# Patient Record
Sex: Female | Born: 1985
Health system: Southern US, Community
[De-identification: ages and names within clinical notes are randomized; demographics above are authoritative.]

## PROBLEM LIST (undated history)

## (undated) ENCOUNTER — Inpatient Hospital Stay: Payer: Self-pay

## (undated) DIAGNOSIS — R319 Hematuria, unspecified: Secondary | ICD-10-CM

## (undated) DIAGNOSIS — F32A Depression, unspecified: Secondary | ICD-10-CM

## (undated) DIAGNOSIS — F419 Anxiety disorder, unspecified: Secondary | ICD-10-CM

## (undated) DIAGNOSIS — U071 COVID-19: Secondary | ICD-10-CM

## (undated) DIAGNOSIS — F329 Major depressive disorder, single episode, unspecified: Secondary | ICD-10-CM

## (undated) DIAGNOSIS — R002 Palpitations: Secondary | ICD-10-CM

## (undated) DIAGNOSIS — E669 Obesity, unspecified: Secondary | ICD-10-CM

## (undated) DIAGNOSIS — L659 Nonscarring hair loss, unspecified: Secondary | ICD-10-CM

## (undated) DIAGNOSIS — Z9289 Personal history of other medical treatment: Secondary | ICD-10-CM

## (undated) DIAGNOSIS — Z8481 Family history of carrier of genetic disease: Secondary | ICD-10-CM

## (undated) DIAGNOSIS — G8929 Other chronic pain: Secondary | ICD-10-CM

## (undated) DIAGNOSIS — Z1371 Encounter for nonprocreative screening for genetic disease carrier status: Secondary | ICD-10-CM

## (undated) DIAGNOSIS — B019 Varicella without complication: Secondary | ICD-10-CM

## (undated) DIAGNOSIS — N200 Calculus of kidney: Secondary | ICD-10-CM

## (undated) DIAGNOSIS — N23 Unspecified renal colic: Secondary | ICD-10-CM

## (undated) HISTORY — DX: Obesity, unspecified: E66.9

## (undated) HISTORY — DX: Varicella without complication: B01.9

## (undated) HISTORY — DX: Personal history of other medical treatment: Z92.89

## (undated) HISTORY — DX: Unspecified renal colic: N23

## (undated) HISTORY — DX: COVID-19: U07.1

## (undated) HISTORY — DX: Family history of carrier of genetic disease: Z84.81

## (undated) HISTORY — DX: Depression, unspecified: F32.A

## (undated) HISTORY — DX: Palpitations: R00.2

## (undated) HISTORY — DX: Hematuria, unspecified: R31.9

## (undated) HISTORY — DX: Other chronic pain: G89.29

## (undated) HISTORY — DX: Major depressive disorder, single episode, unspecified: F32.9

## (undated) HISTORY — PX: WISDOM TOOTH EXTRACTION: SHX21

## (undated) HISTORY — DX: Encounter for nonprocreative screening for genetic disease carrier status: Z13.71

## (undated) HISTORY — DX: Nonscarring hair loss, unspecified: L65.9

## (undated) HISTORY — PX: TONSILLECTOMY: SUR1361

## (undated) HISTORY — DX: Anxiety disorder, unspecified: F41.9

---
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
## 2010-11-08 ENCOUNTER — Observation Stay: Payer: Self-pay | Admitting: Advanced Practice Midwife

## 2011-02-27 ENCOUNTER — Observation Stay: Payer: Self-pay

## 2011-03-01 ENCOUNTER — Observation Stay: Payer: Self-pay | Admitting: Obstetrics and Gynecology

## 2011-03-16 ENCOUNTER — Observation Stay: Payer: Self-pay | Admitting: Emergency Medicine

## 2011-03-20 ENCOUNTER — Inpatient Hospital Stay: Payer: Self-pay | Admitting: Internal Medicine

## 2011-07-07 ENCOUNTER — Ambulatory Visit: Payer: Self-pay | Admitting: Family Medicine

## 2013-09-10 LAB — HM PAP SMEAR: HM Pap smear: NEGATIVE

## 2014-10-10 LAB — OB RESULTS CONSOLE GC/CHLAMYDIA
Chlamydia: NEGATIVE
Gonorrhea: NEGATIVE

## 2014-10-11 LAB — OB RESULTS CONSOLE HIV ANTIBODY (ROUTINE TESTING): HIV: NONREACTIVE

## 2014-11-10 NOTE — L&D Delivery Note (Signed)
Delivery Note   Niayla, Dewan [[id number]]  At 4:37 PM a viable and healthy FEMALE sex was delivered via Vaginal, Spontaneous Delivery (Presentation:OA ;  ).  APGAR: , ; weight  .   Placenta status: Intact, Expressed.  Cord: 3 vessels with the following complications: None.  Anesthesia: Epidural  Episiotomy: None Lacerations: 2nd degree Suture Repair: 2.0 chromic Est. Blood Loss (mL): 500    Higham, PendingBaby [[id number]]  At 4:44 PM a viable and healthy FEMALE sex was delivered via Vaginal, Spontaneous Delivery (Presentation: ; Occiput Anterior).  APGAR: , ; weight  .   Placenta status: , Expressed.  Cord: 3 vessels with the following complications: .  Anesthesia: Epidural  Episiotomy:  None Lacerations:  2nd Degree Suture Repair: 2.0 chromic Est. Blood Loss (mL): 500   Mom to postpartum.   Baby A to NICU.   Baby B to NICU.  Daphine Deutscher A Raeya Merritts 04/26/2015, 5:15 PM

## 2014-11-20 ENCOUNTER — Encounter: Payer: Self-pay | Admitting: Maternal & Fetal Medicine

## 2014-12-04 ENCOUNTER — Encounter: Payer: Self-pay | Admitting: Obstetrics and Gynecology

## 2014-12-12 LAB — OB RESULTS CONSOLE HIV ANTIBODY (ROUTINE TESTING): HIV: NONREACTIVE

## 2014-12-12 LAB — OB RESULTS CONSOLE ABO/RH: RH Type: POSITIVE

## 2014-12-12 LAB — OB RESULTS CONSOLE RUBELLA ANTIBODY, IGM: RUBELLA: NON-IMMUNE/NOT IMMUNE

## 2014-12-12 LAB — OB RESULTS CONSOLE VARICELLA ZOSTER ANTIBODY, IGG: VARICELLA IGG: IMMUNE

## 2014-12-12 LAB — OB RESULTS CONSOLE RPR: RPR: NONREACTIVE

## 2014-12-12 LAB — OB RESULTS CONSOLE ANTIBODY SCREEN: Antibody Screen: NEGATIVE

## 2014-12-12 LAB — OB RESULTS CONSOLE HEPATITIS B SURFACE ANTIGEN: HEP B S AG: NEGATIVE

## 2014-12-18 ENCOUNTER — Encounter: Payer: Self-pay | Admitting: Obstetrics & Gynecology

## 2015-01-01 ENCOUNTER — Encounter: Payer: Self-pay | Admitting: Obstetrics & Gynecology

## 2015-01-07 ENCOUNTER — Observation Stay: Payer: Self-pay | Admitting: Obstetrics and Gynecology

## 2015-01-09 ENCOUNTER — Encounter: Payer: Self-pay | Admitting: Pediatric Cardiology

## 2015-01-12 ENCOUNTER — Emergency Department: Payer: Self-pay | Admitting: Emergency Medicine

## 2015-01-15 ENCOUNTER — Encounter: Payer: Self-pay | Admitting: Obstetrics and Gynecology

## 2015-02-12 ENCOUNTER — Encounter
Admit: 2015-02-12 | Disposition: A | Discharge status: Home / Self Care | Payer: Self-pay | Attending: Obstetrics and Gynecology | Admitting: Obstetrics and Gynecology

## 2015-02-20 ENCOUNTER — Encounter: Admit: 2015-02-20 | Payer: Self-pay | Admitting: Pediatric Cardiology

## 2015-02-23 ENCOUNTER — Other Ambulatory Visit: Payer: Self-pay | Admitting: Obstetrics & Gynecology

## 2015-02-23 DIAGNOSIS — O30039 Twin pregnancy, monochorionic/diamniotic, unspecified trimester: Secondary | ICD-10-CM

## 2015-02-26 ENCOUNTER — Encounter
Admit: 2015-02-26 | Disposition: A | Discharge status: Home / Self Care | Payer: Self-pay | Attending: Obstetrics and Gynecology | Admitting: Obstetrics and Gynecology

## 2015-03-12 ENCOUNTER — Ambulatory Visit
Admission: RE | Admit: 2015-03-12 | Discharge: 2015-03-12 | Disposition: A | Discharge status: Home / Self Care | Payer: No Typology Code available for payment source | Source: Ambulatory Visit | Attending: Obstetrics & Gynecology | Admitting: Obstetrics & Gynecology

## 2015-03-12 ENCOUNTER — Other Ambulatory Visit: Payer: Self-pay | Admitting: Maternal & Fetal Medicine

## 2015-03-12 DIAGNOSIS — O30039 Twin pregnancy, monochorionic/diamniotic, unspecified trimester: Secondary | ICD-10-CM

## 2015-03-12 DIAGNOSIS — Z3A30 30 weeks gestation of pregnancy: Secondary | ICD-10-CM | POA: Insufficient documentation

## 2015-03-12 DIAGNOSIS — O30043 Twin pregnancy, dichorionic/diamniotic, third trimester: Secondary | ICD-10-CM | POA: Diagnosis not present

## 2015-03-12 DIAGNOSIS — O30009 Twin pregnancy, unspecified number of placenta and unspecified number of amniotic sacs, unspecified trimester: Secondary | ICD-10-CM

## 2015-03-12 DIAGNOSIS — O30032 Twin pregnancy, monochorionic/diamniotic, second trimester: Secondary | ICD-10-CM | POA: Diagnosis present

## 2015-03-12 LAB — US OB FOLLOW UP

## 2015-03-20 ENCOUNTER — Encounter: Payer: Self-pay | Admitting: *Deleted

## 2015-03-20 ENCOUNTER — Inpatient Hospital Stay
Admission: EM | Admit: 2015-03-20 | Discharge: 2015-03-20 | Disposition: A | Discharge status: Home / Self Care | Payer: No Typology Code available for payment source | Attending: Obstetrics and Gynecology | Admitting: Obstetrics and Gynecology

## 2015-03-20 DIAGNOSIS — R102 Pelvic and perineal pain: Secondary | ICD-10-CM

## 2015-03-20 DIAGNOSIS — O30033 Twin pregnancy, monochorionic/diamniotic, third trimester: Secondary | ICD-10-CM | POA: Insufficient documentation

## 2015-03-20 DIAGNOSIS — O26899 Other specified pregnancy related conditions, unspecified trimester: Secondary | ICD-10-CM

## 2015-03-20 DIAGNOSIS — Z3A33 33 weeks gestation of pregnancy: Secondary | ICD-10-CM | POA: Diagnosis not present

## 2015-03-20 DIAGNOSIS — Z3490 Encounter for supervision of normal pregnancy, unspecified, unspecified trimester: Secondary | ICD-10-CM

## 2015-03-20 HISTORY — DX: Calculus of kidney: N20.0

## 2015-03-20 NOTE — Discharge Instructions (Signed)
Drink plenty of fluid and get plentyt of rest, call your provider for any other concerns!

## 2015-03-20 NOTE — Outcomes Assessment (Signed)
Patient discharged home, discharge instructions reviewed, pt stats understanding. Pt ambulated off floor in stable condition, denies any other needs at this time.

## 2015-03-20 NOTE — Procedures (Signed)
  Baby A Fetal Heart Rate:  Baseline: 145 bpm  FHR Tracing Category: Cat 1  NST Findings:  reactive  CST:  N/A     Baby B Fetal Heart Rate:130  FHR Tracing Category: Cat 1  NST Findings:  reactive  CST:  N/A    Json Koelzer, Daphine DeutscherMARTIN, MD

## 2015-03-20 NOTE — OB Triage Note (Signed)
Pt here for scheduled NST.

## 2015-03-20 NOTE — H&P (Signed)
L&D Evaluation:  History Expanded:  HPI 29 yo G2P1001 at 3748w2d gestational age by L/9.  Pregnancy complicated by mono-di twin gestation.  She presents with acute-onset lower abdominal pain and discomfort from early this morning. It is described as cramping sensation that starts in her lower abdomen and at times wraps around to her back.  The pain improved this afternoon.  However, she had one episode of vaginal spotting this afernoon.  She notes no urinary symptoms, no vaginal symptoms.  Denies fevers, chills, nausea, vomiting, diarrhea, constipation.  She notes positive fetal movement of both fetuses, no leakage of fluid.   Blood Type (Maternal) A positive   Group B Strep Results Maternal (Result >5wks must be treated as unknown) unknown/result > 5 weeks ago   Maternal HIV Negative   Maternal Syphilis Ab Nonreactive   Maternal Varicella Immune   Rubella Results ([facility]aternal) nonimmune   [facility]   Presents with abdominal pain   Patient's Medical History No Chronic Illness   Patient's Surgical History tonsillectomy   Medications Pre Natal Vitamins  diclegis   Allergies NKDA   Social History none   Family History Non-Contributory   ROS:  ROS All systems were reviewed.  HEENT, CNS, GI, GU, Respiratory, CV, Renal and Musculoskeletal systems were found to be normal., unless noted in HPI   Exam:  Vital Signs T98.4, BP 128/72, p84, RR18   General no apparent distress   Mental Status clear   Chest clear   Heart normal sinus rhythm   Abdomen gravid, non-tender   Estimated Fetal Weight Average for gestational age   Fetal Position baby A (mat Right cephalic), baby B (mat left, breech)   Back no CVAT   Edema no edema   Pelvic no external lesions, cervix closed and thick   Mebranes Intact   FHT normal for gestational age, fetus A 145, fetus B 135   Ucx absent   Skin no lesions   Other Bedside ultrasound performed showing two viable fetuses in the  above-described position.  FHR obtained via M mode for both.  subj normal fluid, placenta is posterior.  Cervix appeared closed.   Impression:  Impression 1) Intrauterine pregnancy at 3748w2d gestational age, 2) mono-di twin gestation, 3) abdominal painj, 4) microscopic hematuria   Plan:  Comments 1) UA - shows hematuria, urine culture, wet prep neg, GC/CT-pending 2) reassuring fetal status on exam and ultrasound 3) hematuria: will send urine for culture.  Discussed possibility of nephroureterolithiasis.  She has no major symptoms at this point.  Discussed potential for worsening flank and back pain.  Will continue to monitor symptoms as outpatient.   Labs:  Lab Results:  Routine Micro:  28-Feb-16 19:21   Micro Text Report WET PREP   COMMENT                   FEW WHITE BLOOD CELLS SEEN   COMMENT                   NO TRICHOMONAS,SPERMATOZOA,YEAST,OR CLUE CELLS SEEN   ANTIBIOTIC                       Comment 1. FEW WHITE BLOOD CELLS SEEN  Comment 2. NO TRICHOMONAS,SPERMATOZOA,YEAST,OR CLUE CELLS SEEN  Result(s) reported on 07 Jan 2015 at 07:44PM.  Routine UA:  28-Feb-16 18:36   Color (UA) Yellow  Clarity (UA) Clear  Glucose (UA) Negative  Bilirubin (UA) Negative  Ketones (UA) Negative  Specific  Gravity (UA) 1.011  Blood (UA) 3+  pH (UA) 6.0  Protein (UA) Negative  Nitrite (UA) Negative  Leukocyte Esterase (UA) Negative (Result(s) reported on 07 Jan 2015 at 07:09PM.)  RBC (UA) 461 /HPF  WBC (UA) NONE SEEN  Bacteria (UA) 1+  Epithelial Cells (UA) 3 /HPF  Mucous (UA) PRESENT (Result(s) reported on 07 Jan 2015 at 07:09PM.)   Electronic Signatures: Conard NovakJackson, Claritza July D (MD)  (Signed 28-Feb-16 21:13)  Authored: L&D Evaluation, Labs   Last Updated: 28-Feb-16 21:13 by Conard NovakJackson, Waunita Sandstrom D (MD)

## 2015-03-26 ENCOUNTER — Ambulatory Visit
Admission: RE | Admit: 2015-03-26 | Discharge: 2015-03-26 | Disposition: A | Discharge status: Home / Self Care | Payer: No Typology Code available for payment source | Source: Ambulatory Visit | Attending: Maternal & Fetal Medicine | Admitting: Maternal & Fetal Medicine

## 2015-03-26 DIAGNOSIS — Z3A32 32 weeks gestation of pregnancy: Secondary | ICD-10-CM | POA: Diagnosis not present

## 2015-03-26 DIAGNOSIS — O30009 Twin pregnancy, unspecified number of placenta and unspecified number of amniotic sacs, unspecified trimester: Secondary | ICD-10-CM

## 2015-03-26 DIAGNOSIS — O30033 Twin pregnancy, monochorionic/diamniotic, third trimester: Secondary | ICD-10-CM | POA: Diagnosis not present

## 2015-03-26 LAB — US OB LIMITED

## 2015-03-28 ENCOUNTER — Observation Stay
Admission: EM | Admit: 2015-03-28 | Discharge: 2015-03-28 | Disposition: A | Discharge status: Home / Self Care | Payer: No Typology Code available for payment source | Attending: Obstetrics and Gynecology | Admitting: Obstetrics and Gynecology

## 2015-03-28 DIAGNOSIS — O26899 Other specified pregnancy related conditions, unspecified trimester: Principal | ICD-10-CM

## 2015-03-28 DIAGNOSIS — R102 Pelvic and perineal pain: Secondary | ICD-10-CM | POA: Insufficient documentation

## 2015-03-28 LAB — URINALYSIS COMPLETE WITH MICROSCOPIC (ARMC ONLY)
Bilirubin Urine: NEGATIVE
Glucose, UA: 50 mg/dL — AB
Hgb urine dipstick: NEGATIVE
Ketones, ur: NEGATIVE mg/dL
Nitrite: NEGATIVE
PROTEIN: NEGATIVE mg/dL
Specific Gravity, Urine: 1.008 (ref 1.005–1.030)
pH: 6 (ref 5.0–8.0)

## 2015-03-28 NOTE — Discharge Instructions (Signed)
Drink plenty of fluids and get plenty of rest, call your provider for any other concerns. [facility]

## 2015-03-28 NOTE — Discharge Summary (Signed)
Patient discharged home, discharge instructions given, patient states understanding. Patient left floor in stable condition, denies any other needs at this time. Patient to keep next scheduled OB appointment: Wednesday.

## 2015-03-28 NOTE — OB Triage Provider Note (Signed)
  History    28 yoWMF G2P1001 at 32.5 weeks with Mono/Di Vtx/? Concordant twins presents with pelvic pressure. N o fluid leak or vaginal bleeding. CSN: [id number]  Arrival date and time: 03/28/15 1337   None     Chief Complaint  Patient presents with  . Pelvic Pain   Pelvic Pain The patient's primary symptoms include pelvic pain.    OB History    Gravida Para Term Preterm AB TAB SAB Ectopic Multiple Living   2 1 1       1       Past Medical History  Diagnosis Date  . Kidney stone     Past Surgical History  Procedure Laterality Date  . Tonsillectomy    . Wisdom tooth extraction      No family history on file.  History  Substance Use Topics  . Smoking status: Never Smoker   . Smokeless tobacco: Never Used  . Alcohol Use: No    Allergies: No Known Allergies  Prescriptions prior to admission  Medication Sig Dispense Refill Last Dose  . folic acid (FOLVITE) 1 MG tablet Take 1 mg by mouth daily.     . nitrofurantoin, macrocrystal-monohydrate, (MACROBID) 100 MG capsule Take 100 mg by mouth at bedtime.     . Prenatal Vit-Fe Fumarate-FA (PRENATAL MULTIVITAMIN) TABS tablet Take 1 tablet by mouth daily at 12 noon.       Review of Systems  Genitourinary: Positive for pelvic pain.   Physical Exam   Blood pressure 119/72, pulse 89, temperature 98.3 F (36.8 C), temperature source Oral, last menstrual period 08/11/2014.  Physical Exam  Constitutional: She appears well-nourished. No distress.  GI: Soft. There is no tenderness.  Genitourinary: Vagina normal and uterus normal. No vaginal discharge found.  Skin: Skin is warm and dry.  Psychiatric: She has a normal mood and affect.  NAD; Uterus nontender; Cervix 1-2/30/-2/VTX  MAU Course  Procedures NST INTERPRETATION:  Indications: multiple gestation  Mode: External Baseline Rate (A): 130 bpm ; Baseline Rate (B) 140 bpm  Variability: Moderate; (B) Moderate Accelerations: 15 x 15; (B) 15 x 15 Decelerations: None;  (B) NBone     Contraction Frequency (min): irregular, occasional   Impression: reactive, reactive Mono/Di Twins   Plan: 1.) [facility]KC BID. 2. Weekly NST alternating with Weekly MFM Doppler studies. 3.) Fetal ECHO on 04/10/15 for assessment of fetal heart to [facility].    Javaris Wigington, Daphine DeutscherMARTIN, MD  MDM   Assessment and Plan    Chase Knebel 03/28/2015, 4:54 PM

## 2015-03-28 NOTE — OB Triage Note (Signed)
Complains of rectal pressure.

## 2015-03-28 NOTE — OB Triage Note (Signed)
Patient states she started having pelvic pressure yesterday, pt states that it is constant and does not rate on a pain scale, "its just pressure". Pt also states that she has been peeing more often than normal pt denies any bleeding or burning

## 2015-03-31 LAB — URINE CULTURE: Special Requests: NORMAL

## 2015-04-04 ENCOUNTER — Encounter: Payer: Self-pay | Admitting: *Deleted

## 2015-04-04 ENCOUNTER — Observation Stay
Admission: EM | Admit: 2015-04-04 | Discharge: 2015-04-04 | Disposition: A | Discharge status: Home / Self Care | Payer: No Typology Code available for payment source | Attending: Obstetrics and Gynecology | Admitting: Obstetrics and Gynecology

## 2015-04-04 DIAGNOSIS — O30039 Twin pregnancy, monochorionic/diamniotic, unspecified trimester: Principal | ICD-10-CM | POA: Insufficient documentation

## 2015-04-04 DIAGNOSIS — Z3689 Encounter for other specified antenatal screening: Secondary | ICD-10-CM

## 2015-04-04 LAB — OB RESULTS CONSOLE GBS: STREP GROUP B AG: NEGATIVE

## 2015-04-04 LAB — OB RESULTS CONSOLE GC/CHLAMYDIA
CHLAMYDIA, DNA PROBE: NEGATIVE
Gonorrhea: NEGATIVE

## 2015-04-09 NOTE — Progress Notes (Signed)
NST INTERPRETATION:  Indications: multiple gestation and Mono/Di  Mode: External Baseline Rate (A): 130 bpm  (B) 135 Variability: Moderate  (B) Moderate Accelerations: 15 x 15 (B) 15 x 15 Decelerations: None  (B) None     Contraction Frequency (min): irritablility   Impression: reactive/reactive Mono/Di Twins   Plan: Continue Antepartum testing as recommended per [facility].    Scheryl MartenMarth Edelmiro Innocent, MD

## 2015-04-11 ENCOUNTER — Ambulatory Visit (INDEPENDENT_AMBULATORY_CARE_PROVIDER_SITE_OTHER): Payer: No Typology Code available for payment source | Admitting: Obstetrics and Gynecology

## 2015-04-11 VITALS — BP 112/77 | Wt 212.2 lb

## 2015-04-11 DIAGNOSIS — Z331 Pregnant state, incidental: Secondary | ICD-10-CM

## 2015-04-11 DIAGNOSIS — Z1389 Encounter for screening for other disorder: Secondary | ICD-10-CM

## 2015-04-11 LAB — POCT URINALYSIS DIPSTICK
BILIRUBIN UA: NEGATIVE
Glucose, UA: NEGATIVE
Ketones, UA: NEGATIVE
NITRITE UA: NEGATIVE
PH UA: 6
Spec Grav, UA: 1.02
UROBILINOGEN UA: 0.2

## 2015-04-11 NOTE — Progress Notes (Signed)
Subjective:Patient complains of sore throat and nasal congestion.  Running nose is most significant issue.  No fever, chills or sweats.  Mild cough. Objective: No acute distress.  Oropharynx clear.  Tympanic membranes normal bilaterally.  Lungs clear. Assessment: Probable allergies. Plan: Antihistamine when necessary; saltwater gargles when necessary; Cepacol lozenges when necessary.

## 2015-04-12 ENCOUNTER — Ambulatory Visit
Admission: RE | Admit: 2015-04-12 | Discharge: 2015-04-12 | Disposition: A | Discharge status: Home / Self Care | Payer: No Typology Code available for payment source | Source: Ambulatory Visit | Attending: Maternal & Fetal Medicine | Admitting: Maternal & Fetal Medicine

## 2015-04-12 DIAGNOSIS — O30039 Twin pregnancy, monochorionic/diamniotic, unspecified trimester: Secondary | ICD-10-CM

## 2015-04-12 DIAGNOSIS — O30033 Twin pregnancy, monochorionic/diamniotic, third trimester: Secondary | ICD-10-CM | POA: Insufficient documentation

## 2015-04-12 DIAGNOSIS — IMO0001 Reserved for inherently not codable concepts without codable children: Secondary | ICD-10-CM

## 2015-04-16 ENCOUNTER — Ambulatory Visit
Admission: RE | Admit: 2015-04-16 | Discharge: 2015-04-16 | Disposition: A | Discharge status: Home / Self Care | Payer: No Typology Code available for payment source | Source: Ambulatory Visit | Attending: Obstetrics & Gynecology | Admitting: Obstetrics & Gynecology

## 2015-04-16 DIAGNOSIS — Z3A35 35 weeks gestation of pregnancy: Secondary | ICD-10-CM | POA: Diagnosis not present

## 2015-04-16 DIAGNOSIS — O30009 Twin pregnancy, unspecified number of placenta and unspecified number of amniotic sacs, unspecified trimester: Secondary | ICD-10-CM

## 2015-04-16 DIAGNOSIS — O30033 Twin pregnancy, monochorionic/diamniotic, third trimester: Secondary | ICD-10-CM | POA: Insufficient documentation

## 2015-04-16 DIAGNOSIS — IMO0001 Reserved for inherently not codable concepts without codable children: Secondary | ICD-10-CM

## 2015-04-16 LAB — US OB LIMITED

## 2015-04-18 ENCOUNTER — Ambulatory Visit (INDEPENDENT_AMBULATORY_CARE_PROVIDER_SITE_OTHER): Payer: No Typology Code available for payment source | Admitting: Obstetrics and Gynecology

## 2015-04-18 VITALS — BP 115/76 | HR 89 | Wt 217.2 lb

## 2015-04-18 DIAGNOSIS — O30033 Twin pregnancy, monochorionic/diamniotic, third trimester: Secondary | ICD-10-CM

## 2015-04-18 DIAGNOSIS — Z3493 Encounter for supervision of normal pregnancy, unspecified, third trimester: Secondary | ICD-10-CM

## 2015-04-18 LAB — POCT URINALYSIS DIPSTICK
Bilirubin, UA: NEGATIVE
Blood, UA: NEGATIVE
GLUCOSE UA: NEGATIVE
Ketones, UA: NEGATIVE
NITRITE UA: NEGATIVE
Protein, UA: NEGATIVE
SPEC GRAV UA: 1.015
Urobilinogen, UA: 0.2
pH, UA: 7

## 2015-04-18 NOTE — Progress Notes (Signed)
Pt states last few days both legs swell and hurt. Starts about mid day.

## 2015-04-18 NOTE — Progress Notes (Signed)
No PIH symptoms. Plan: IOL at 37.0 weeks.

## 2015-04-18 NOTE — Patient Instructions (Signed)
Plan: 1.   [facility]. 2.  IOL at 37.0 weeks.

## 2015-04-19 ENCOUNTER — Ambulatory Visit
Admission: RE | Admit: 2015-04-19 | Discharge: 2015-04-19 | Disposition: A | Discharge status: Home / Self Care | Payer: No Typology Code available for payment source | Source: Ambulatory Visit | Attending: Maternal & Fetal Medicine | Admitting: Maternal & Fetal Medicine

## 2015-04-19 DIAGNOSIS — Z3A35 35 weeks gestation of pregnancy: Secondary | ICD-10-CM | POA: Diagnosis not present

## 2015-04-19 DIAGNOSIS — Z3689 Encounter for other specified antenatal screening: Secondary | ICD-10-CM

## 2015-04-19 DIAGNOSIS — O30013 Twin pregnancy, monochorionic/monoamniotic, third trimester: Secondary | ICD-10-CM | POA: Insufficient documentation

## 2015-04-23 ENCOUNTER — Ambulatory Visit
Admission: RE | Admit: 2015-04-23 | Discharge: 2015-04-23 | Disposition: A | Discharge status: Home / Self Care | Payer: No Typology Code available for payment source | Source: Ambulatory Visit | Attending: Obstetrics & Gynecology | Admitting: Obstetrics & Gynecology

## 2015-04-23 DIAGNOSIS — O30033 Twin pregnancy, monochorionic/diamniotic, third trimester: Secondary | ICD-10-CM | POA: Insufficient documentation

## 2015-04-23 DIAGNOSIS — O30013 Twin pregnancy, monochorionic/monoamniotic, third trimester: Secondary | ICD-10-CM

## 2015-04-25 ENCOUNTER — Encounter: Payer: Self-pay | Admitting: Obstetrics and Gynecology

## 2015-04-25 ENCOUNTER — Ambulatory Visit (INDEPENDENT_AMBULATORY_CARE_PROVIDER_SITE_OTHER): Payer: No Typology Code available for payment source | Admitting: Obstetrics and Gynecology

## 2015-04-25 VITALS — BP 125/80 | HR 76 | Wt 218.3 lb

## 2015-04-25 DIAGNOSIS — R319 Hematuria, unspecified: Secondary | ICD-10-CM

## 2015-04-25 DIAGNOSIS — Z369 Encounter for antenatal screening, unspecified: Secondary | ICD-10-CM

## 2015-04-25 DIAGNOSIS — Z36 Encounter for antenatal screening of mother: Secondary | ICD-10-CM

## 2015-04-25 DIAGNOSIS — Z1389 Encounter for screening for other disorder: Secondary | ICD-10-CM

## 2015-04-25 DIAGNOSIS — O0993 Supervision of high risk pregnancy, unspecified, third trimester: Secondary | ICD-10-CM

## 2015-04-25 LAB — POCT URINALYSIS DIPSTICK
Bilirubin, UA: NEGATIVE
Glucose, UA: NEGATIVE
Ketones, UA: NEGATIVE
Nitrite, UA: NEGATIVE
PH UA: 6
PROTEIN UA: NEGATIVE
Spec Grav, UA: 1.01
Urobilinogen, UA: 0.2

## 2015-04-25 NOTE — Progress Notes (Signed)
Obstetric History and Physical  Caycee Zakkiyya Barno is a 29 y.o. G2P1001 with Mono/di twinIUP at 45w5dpresenting for Pitocin induction of labor. Patient states she has been having  irregular, none vaginal bleeding, intact membranes, with active fetal movement.    Prenatal Course [facility] Pregnancy complications or risks: Patient Active Problem List   Diagnosis Date Noted  . Pelvic pain affecting pregnancy 03/28/2015  . Pregnancy 03/20/2015  . Monochorionic diamniotic twin gestation in third trimester    She plans to breastfeed She desires condoms for postpartum contraception.   Prenatal labs and studies: ABO, Rh: A/Positive/-- (02/02 0000) Antibody: Negative (02/02 0000) Rubella: Nonimmune (02/02 0000) RPR: Nonreactive (02/02 0000)  HBsAg: Negative (02/02 0000)  HIV: Non-reactive (02/02 0000)  GOXB:DZHGDJME(05/25 0000) 1 hr Glucola  Normal Genetic screening normal Anatomy UKoreaabnormal with questionable valvular abnormality on twin A; [facility]  Maternal Diabetes: No Genetic Screening: Normal Maternal Ultrasounds/Referrals: Abnormal:  Findings:   Other:Fetal valvular abnormality, monitored, twin A Fetal Ultrasounds or other Referrals:  Other: Pediatric cardiology assessed Fetal cardiac anatomy and [facility] Maternal Substance Abuse:  No Significant Maternal Medications:  None Significant Maternal Lab Results: None  Past Medical History  Diagnosis Date  . Kidney stone     macrobid daily  . Hematuria   . BRCA negative     [place] brca 2 gene in family  . Ureteral colic     Past Surgical History  Procedure Laterality Date  . Tonsillectomy    . Wisdom tooth extraction      OB History  Gravida Para Term Preterm AB SAB TAB Ectopic Multiple Living  '2  1 1       2    ' # Outcome Date GA Lbr Len/2nd Weight Sex Delivery Anes PTL Lv  2 Current           1 Term 2010   7 lb 10 oz (3.459 kg) M Vag-Spont   Y    Obstetric Comments  Baby A-decreased movement.    History   Social History  . Marital Status: Married    Spouse Name: N/A  . Number of Children: N/A  . 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 Smoking status: Never Smoker   . Smokeless tobacco: Never Used  . Alcohol Use: No  . Drug Use: No  . Sexual Activity: No   Other Topics Concern  . None   Social History Narrative    Family History  Problem Relation Age of Onset  . Ovarian cancer Maternal Grandmother 30  . Breast cancer Maternal Grandmother 434 . Pancreatic cancer Mother   . Ovarian cancer Maternal Aunt 65  . Breast cancer Maternal Aunt   . Skin cancer Maternal Aunt      (Not in a hospital admission)  Allergies  Allergen Reactions  . Codeine Nausea Only    Review of Systems: Negative except for what is mentioned in HPI.  Physical Exam: BP 125/80 mmHg  Pulse 76  Wt 218 lb 5 oz (99.026 kg)  LMP 08/11/2014 (Exact Date) CONSTITUTIONAL: Well-developed, well-nourished female in no acute distress.  HENT:  Normocephalic, atraumatic, External right and left ear normal. Oropharynx is clear and moist EYES: Conjunctivae and EOM are normal.  Pupils are equal, round, and reactive to light. No scleral icterus.  NECK: Normal range of motion, supple, no masses SKIN: Skin is warm and dry. No rash noted. Not diaphoretic. No erythema. No pallor. [place]: Alert and oriented to person, place, and time. Normal reflexes, muscle tone coordination. No cranial nerve deficit noted. PSYCHIATRIC: Normal mood and affect. Normal behavior. Normal judgment and thought content. CARDIOVASCULAR: Normal heart rate noted, regular rhythm RESPIRATORY: Effort and breath sounds normal, no problems with respiration  noted ABDOMEN: Soft, nontender, nondistended, gravid.Fundal height 41 cm MUSCULOSKELETAL: Normal range of motion. 2+ edema and no tenderness. 2+ distal pulses.  Cervical Exam: Dilatation 3.5cm   Effacement 80%   Station -1   Presentation: cephalic FHT:  Baseline rate 128/141 bpm      Pertinent Labs/Studies:   Results for orders placed or performed in visit on 04/25/15 (from the past 24 hour(s))  POCT urinalysis dipstick     Status: Abnormal   Collection Time: 04/25/15  2:50 PM  Result Value Ref Range   Color, UA yellow    Clarity, UA cloudy    Glucose, UA negative    Bilirubin, UA negative    Ketones, UA negative    Spec Grav, UA 1.010    Blood, UA moderate    pH, UA 6.0    Protein, UA negative    Urobilinogen, UA 0.2    Nitrite, UA negative    Leukocytes, UA small (1+) (A) Negative    Assessment : Miquel Lamson is a 29 y.o. G2P1002 at 17w5dwith mono/Di twins being admitted for labor Induction. Plan: Labor:Induction/Augmentation as needed, per protocol. FWB: Reassuring fetal heart tracing.  GBS negative Epidural as needed. Delivery plan: Hopeful for vaginal delivery  Victorious Kundinger A. DZipporah Plants MD, FACOG [facility]

## 2015-04-25 NOTE — Progress Notes (Signed)
ZIKA EXPOSURE SCREEN:  The patient has not traveled to a Zika Virus endemic area within the past 6 months, nor has she had unprotected sex with a partner who has travelled to a Zika endemic region within the past 6 months. The patient has been advised to notify us if these factors change any time during this current pregnancy, so adequate testing and monitoring can be initiated.  

## 2015-04-26 ENCOUNTER — Inpatient Hospital Stay: Payer: No Typology Code available for payment source | Admitting: Certified Registered"

## 2015-04-26 ENCOUNTER — Inpatient Hospital Stay: Admission: RE | Admit: 2015-04-26 | Payer: No Typology Code available for payment source | Source: Ambulatory Visit

## 2015-04-26 ENCOUNTER — Inpatient Hospital Stay
Admission: EM | Admit: 2015-04-26 | Discharge: 2015-04-28 | DRG: 775 | Disposition: A | Discharge status: Home / Self Care | Payer: No Typology Code available for payment source | Attending: Obstetrics and Gynecology | Admitting: Obstetrics and Gynecology

## 2015-04-26 ENCOUNTER — Encounter: Payer: Self-pay | Admitting: *Deleted

## 2015-04-26 DIAGNOSIS — O30033 Twin pregnancy, monochorionic/diamniotic, third trimester: Secondary | ICD-10-CM | POA: Diagnosis present

## 2015-04-26 DIAGNOSIS — Z3490 Encounter for supervision of normal pregnancy, unspecified, unspecified trimester: Secondary | ICD-10-CM

## 2015-04-26 DIAGNOSIS — Z3A36 36 weeks gestation of pregnancy: Secondary | ICD-10-CM | POA: Diagnosis present

## 2015-04-26 DIAGNOSIS — O26833 Pregnancy related renal disease, third trimester: Secondary | ICD-10-CM | POA: Diagnosis present

## 2015-04-26 DIAGNOSIS — O30043 Twin pregnancy, dichorionic/diamniotic, third trimester: Secondary | ICD-10-CM | POA: Diagnosis present

## 2015-04-26 DIAGNOSIS — N2889 Other specified disorders of kidney and ureter: Secondary | ICD-10-CM | POA: Diagnosis present

## 2015-04-26 DIAGNOSIS — Z3483 Encounter for supervision of other normal pregnancy, third trimester: Secondary | ICD-10-CM | POA: Diagnosis not present

## 2015-04-26 DIAGNOSIS — R319 Hematuria, unspecified: Secondary | ICD-10-CM | POA: Diagnosis present

## 2015-04-26 DIAGNOSIS — N200 Calculus of kidney: Secondary | ICD-10-CM | POA: Diagnosis present

## 2015-04-26 DIAGNOSIS — IMO0001 Reserved for inherently not codable concepts without codable children: Secondary | ICD-10-CM

## 2015-04-26 LAB — CBC
HEMATOCRIT: 36 % (ref 35.0–47.0)
Hemoglobin: 12.1 g/dL (ref 12.0–16.0)
MCH: 31.1 pg (ref 26.0–34.0)
MCHC: 33.7 g/dL (ref 32.0–36.0)
MCV: 92.3 fL (ref 80.0–100.0)
Platelets: 223 10*3/uL (ref 150–440)
RBC: 3.9 MIL/uL (ref 3.80–5.20)
RDW: 13.9 % (ref 11.5–14.5)
WBC: 10 10*3/uL (ref 3.6–11.0)

## 2015-04-26 LAB — ABO/RH: ABO/RH(D): A POS

## 2015-04-26 LAB — TYPE AND SCREEN
ABO/RH(D): A POS
Antibody Screen: NEGATIVE

## 2015-04-26 MED ORDER — IBUPROFEN 800 MG PO TABS
800.0000 mg | ORAL_TABLET | Freq: Three times a day (TID) | ORAL | Status: DC
  Administered 2015-04-26 – 2015-04-28 (×5): 800 mg via ORAL
  Filled 2015-04-26 (×5): qty 1

## 2015-04-26 MED ORDER — LIDOCAINE HCL (PF) 1 % IJ SOLN
INTRAMUSCULAR | Status: AC
  Filled 2015-04-26: qty 30

## 2015-04-26 MED ORDER — DIBUCAINE 1 % RE OINT: 1.0000 "application " | TOPICAL_OINTMENT | RECTAL | Status: DC | PRN

## 2015-04-26 MED ORDER — OXYTOCIN 40 UNITS IN LACTATED RINGERS INFUSION - SIMPLE MED
1.0000 m[IU]/min | INTRAVENOUS | Status: DC
  Administered 2015-04-26: 1 m[IU]/min via INTRAVENOUS

## 2015-04-26 MED ORDER — BENZOCAINE-MENTHOL 20-0.5 % EX AERO
1.0000 "application " | INHALATION_SPRAY | CUTANEOUS | Status: DC | PRN
  Administered 2015-04-28: 1 via TOPICAL
  Filled 2015-04-26 (×2): qty 56

## 2015-04-26 MED ORDER — OXYTOCIN 10 UNIT/ML IJ SOLN
INTRAMUSCULAR | Status: AC
  Filled 2015-04-26: qty 2

## 2015-04-26 MED ORDER — OXYTOCIN BOLUS FROM INFUSION
500.0000 mL | INTRAVENOUS | Status: DC
  Administered 2015-04-26: 500 mL via INTRAVENOUS

## 2015-04-26 MED ORDER — PRENATAL MULTIVITAMIN CH
1.0000 | ORAL_TABLET | Freq: Every day | ORAL | Status: DC
  Administered 2015-04-27 – 2015-04-28 (×2): 1 via ORAL
  Filled 2015-04-26 (×2): qty 1

## 2015-04-26 MED ORDER — LANOLIN HYDROUS EX OINT
TOPICAL_OINTMENT | CUTANEOUS | Status: DC | PRN
Start: 2015-04-26 — End: 2015-04-28

## 2015-04-26 MED ORDER — FENTANYL 2.5 MCG/ML W/ROPIVACAINE 0.2% IN NS 100 ML EPIDURAL INFUSION (ARMC-ANES)
EPIDURAL | Status: AC
  Administered 2015-04-26: 9 mL/h via EPIDURAL
  Filled 2015-04-26: qty 100

## 2015-04-26 MED ORDER — ONDANSETRON HCL 4 MG/2ML IJ SOLN: 4.0000 mg | Freq: Four times a day (QID) | INTRAMUSCULAR | Status: DC | PRN

## 2015-04-26 MED ORDER — ONDANSETRON HCL 4 MG/2ML IJ SOLN: 4.0000 mg | INTRAMUSCULAR | Status: DC | PRN

## 2015-04-26 MED ORDER — OXYCODONE-ACETAMINOPHEN 5-325 MG PO TABS
1.0000 | ORAL_TABLET | ORAL | Status: DC | PRN
  Administered 2015-04-26 – 2015-04-28 (×7): 1 via ORAL
  Filled 2015-04-26 (×6): qty 1

## 2015-04-26 MED ORDER — LIDOCAINE-EPINEPHRINE (PF) 1.5 %-1:200000 IJ SOLN
INTRAMUSCULAR | Status: DC | PRN
  Administered 2015-04-26 (×2): 3 mL

## 2015-04-26 MED ORDER — BUPIVACAINE HCL (PF) 0.25 % IJ SOLN
INTRAMUSCULAR | Status: DC | PRN
  Administered 2015-04-26 (×2): 4 mL

## 2015-04-26 MED ORDER — FERROUS SULFATE 325 (65 FE) MG PO TABS
325.0000 mg | ORAL_TABLET | Freq: Two times a day (BID) | ORAL | Status: DC
  Administered 2015-04-27 – 2015-04-28 (×3): 325 mg via ORAL
  Filled 2015-04-26 (×3): qty 1

## 2015-04-26 MED ORDER — CITRIC ACID-SODIUM CITRATE 334-500 MG/5ML PO SOLN: 30.0000 mL | ORAL | Status: DC | PRN

## 2015-04-26 MED ORDER — ACETAMINOPHEN 325 MG PO TABS
650.0000 mg | ORAL_TABLET | ORAL | Status: DC | PRN
Start: 2015-04-26 — End: 2015-04-28

## 2015-04-26 MED ORDER — ONDANSETRON HCL 4 MG PO TABS
4.0000 mg | ORAL_TABLET | ORAL | Status: DC | PRN
Start: 2015-04-26 — End: 2015-04-28

## 2015-04-26 MED ORDER — LIDOCAINE HCL (PF) 1 % IJ SOLN: 30.0000 mL | INTRAMUSCULAR | Status: DC | PRN

## 2015-04-26 MED ORDER — SIMETHICONE 80 MG PO CHEW: 80.0000 mg | CHEWABLE_TABLET | ORAL | Status: DC | PRN

## 2015-04-26 MED ORDER — MEASLES, MUMPS & RUBELLA VAC ~~LOC~~ INJ
0.5000 mL | INJECTION | Freq: Once | SUBCUTANEOUS | Status: AC
  Administered 2015-04-28: 0.5 mL via SUBCUTANEOUS
  Filled 2015-04-26 (×2): qty 0.5

## 2015-04-26 MED ORDER — DOCUSATE SODIUM 100 MG PO CAPS
100.0000 mg | ORAL_CAPSULE | Freq: Two times a day (BID) | ORAL | Status: DC
  Administered 2015-04-26 – 2015-04-28 (×4): 100 mg via ORAL
  Filled 2015-04-26 (×4): qty 1

## 2015-04-26 MED ORDER — OXYCODONE-ACETAMINOPHEN 5-325 MG PO TABS: 1.0000 | ORAL_TABLET | ORAL | Status: DC | PRN

## 2015-04-26 MED ORDER — AMMONIA AROMATIC IN INHA
RESPIRATORY_TRACT | Status: AC
  Filled 2015-04-26: qty 10

## 2015-04-26 MED ORDER — ACETAMINOPHEN 325 MG PO TABS
650.0000 mg | ORAL_TABLET | ORAL | Status: DC | PRN
  Administered 2015-04-26: 650 mg via ORAL

## 2015-04-26 MED ORDER — DIPHENHYDRAMINE HCL 25 MG PO CAPS: 25.0000 mg | ORAL_CAPSULE | Freq: Four times a day (QID) | ORAL | Status: DC | PRN

## 2015-04-26 MED ORDER — LACTATED RINGERS IV SOLN: 500.0000 mL | INTRAVENOUS | Status: DC | PRN

## 2015-04-26 MED ORDER — OXYCODONE-ACETAMINOPHEN 5-325 MG PO TABS: 2.0000 | ORAL_TABLET | ORAL | Status: DC | PRN

## 2015-04-26 MED ORDER — OXYTOCIN 40 UNITS IN LACTATED RINGERS INFUSION - SIMPLE MED
62.5000 mL/h | INTRAVENOUS | Status: DC
  Administered 2015-04-26: 62.5 mL/h via INTRAVENOUS

## 2015-04-26 MED ORDER — TETANUS-DIPHTH-ACELL PERTUSSIS 5-2.5-18.5 LF-MCG/0.5 IM SUSP: 0.5000 mL | INTRAMUSCULAR | Status: DC | PRN

## 2015-04-26 MED ORDER — BUPIVACAINE HCL (PF) 0.25 % IJ SOLN
INTRAMUSCULAR | Status: DC | PRN
  Administered 2015-04-26 (×2): 5 mL via EPIDURAL

## 2015-04-26 MED ORDER — WITCH HAZEL-GLYCERIN EX PADS: 1.0000 "application " | MEDICATED_PAD | CUTANEOUS | Status: DC | PRN

## 2015-04-26 MED ORDER — OXYCODONE-ACETAMINOPHEN 5-325 MG PO TABS
2.0000 | ORAL_TABLET | ORAL | Status: DC | PRN
  Filled 2015-04-26: qty 2

## 2015-04-26 MED ORDER — LACTATED RINGERS IV SOLN
INTRAVENOUS | Status: DC
  Administered 2015-04-26: 08:00:00 via INTRAVENOUS

## 2015-04-26 MED ORDER — TERBUTALINE SULFATE 1 MG/ML IJ SOLN: 0.2500 mg | Freq: Once | INTRAMUSCULAR | Status: DC | PRN

## 2015-04-26 MED ORDER — OXYTOCIN 40 UNITS IN LACTATED RINGERS INFUSION - SIMPLE MED
INTRAVENOUS | Status: AC
  Administered 2015-04-26: 1 m[IU]/min via INTRAVENOUS
  Filled 2015-04-26: qty 1000

## 2015-04-26 MED ORDER — ACETAMINOPHEN 325 MG PO TABS
ORAL_TABLET | ORAL | Status: AC
  Administered 2015-04-26: 650 mg via ORAL
  Filled 2015-04-26: qty 2

## 2015-04-26 MED ORDER — MISOPROSTOL 200 MCG PO TABS
ORAL_TABLET | ORAL | Status: AC
  Filled 2015-04-26: qty 4

## 2015-04-26 NOTE — Progress Notes (Signed)
Baby A HR dropped from baseline to 60s-90s for 3 mins between 1510-1513 with gradual return to baseline after pt. Was repositioned. Dr. Greggory Keen notified @1525 . Approved continuing to run pitocin as long as HR returned to baseline.

## 2015-04-26 NOTE — Anesthesia Preprocedure Evaluation (Signed)
Anesthesia Evaluation  Patient identified by MRN, date of birth, ID band Patient awake    Reviewed: Allergy & Precautions, H&P , NPO status , Patient's Chart, lab work & pertinent test results  History of Anesthesia Complications Negative for: history of anesthetic complications  Airway Mallampati: I  TM Distance: >3 FB Neck ROM: full    Dental no notable dental hx.    Pulmonary neg pulmonary ROS, neg pneumonia -,    Pulmonary exam normal       Cardiovascular negative cardio ROS Normal cardiovascular examRhythm:regular Rate:Normal     Neuro/Psych negative neurological ROS  negative psych ROS   GI/Hepatic Neg liver ROS, GERD-  ,  Endo/Other  negative endocrine ROS  Renal/GU Renal disease  negative genitourinary   Musculoskeletal   Abdominal   Peds  Hematology negative hematology ROS (+)   Anesthesia Other Findings   Reproductive/Obstetrics (+) Pregnancy                             Anesthesia Physical Anesthesia Plan  ASA: II  Anesthesia Plan: Epidural   Post-op Pain Management:    Induction:   Airway Management Planned:   Additional Equipment:   Intra-op Plan:   Post-operative Plan:   Informed Consent: I have reviewed the patients History and Physical, chart, labs and discussed the procedure including the risks, benefits and alternatives for the proposed anesthesia with the patient or authorized representative who has indicated his/her understanding and acceptance.     Plan Discussed with: CRNA  Anesthesia Plan Comments:         Anesthesia Quick Evaluation

## 2015-04-26 NOTE — H&P (Signed)
See Progress note from 04/25/2015. No Interval changes

## 2015-04-26 NOTE — Anesthesia Procedure Notes (Addendum)
Epidural Patient location during procedure: OB Start time: 04/26/2015 12:10 PM End time: 04/26/2015 12:35 PM  Staffing Anesthesiologist: Forestine Chute Resident/CRNA: Mathews Argyle Performed by: resident/CRNA   Preanesthetic Checklist Completed: patient identified, site marked, surgical consent, pre-op evaluation, timeout performed, IV checked, risks and benefits discussed and monitors and equipment checked  Epidural Patient position: sitting Prep: Betadine and site prepped and draped Patient monitoring: heart rate, continuous pulse ox and blood pressure Approach: midline Location: L4-L5 Injection technique: LOR saline  Needle:  Needle type: Tuohy  Needle gauge: 18 G Needle length: 9 cm and 9 Needle insertion depth: 5 cm Catheter type: closed end flexible Catheter size: 20 Guage Catheter at skin depth: 9 cm Test dose: negative and 1.5% lidocaine with Epi 1:200 K  Assessment Events: blood not aspirated, injection not painful, no injection resistance, negative IV test and no paresthesia  Additional Notes   Patient tolerated the insertion well without complications.Reason for block:procedure for pain  Epidural Patient location during procedure: OB Start time: 04/26/2015 1:55 PM End time: 04/26/2015 2:02 PM  Staffing Anesthesiologist: Forestine Chute Performed by: anesthesiologist   Preanesthetic Checklist Completed: patient identified, site marked, surgical consent, pre-op evaluation, timeout performed, IV checked, risks and benefits discussed and monitors and equipment checked  Epidural Patient position: sitting Prep: ChloraPrep Patient monitoring: blood pressure, continuous pulse ox and heart rate Approach: midline Location: L3-L4 Injection technique: LOR saline  Needle:  Needle type: Tuohy  Needle gauge: 18 G Needle length: 9 cm Needle insertion depth: 7 cm Catheter type: closed end Catheter size: 20 Guage Catheter at skin depth: 12 cm Test dose: negative  and 1.5% lidocaine with Epi 1:200 K  Assessment Sensory level: T10 Events: blood not aspirated, injection not painful, no injection resistance and no paresthesia  Additional Notes Reason for block:procedure for pain

## 2015-04-26 NOTE — H&P (Signed)
Expand All Collapse All   Obstetric History and Physical  Anita Jacobs is a 29 y.o. G2P1001 with Mono/di twinIUP at 74w5dpresenting for Pitocin induction of labor. Patient states she has been having irregular, none vaginal bleeding, intact membranes, with active fetal movement.   Prenatal Course Source of Care: [facility] Pregnancy complications or risks: Patient Active Problem List   Diagnosis Date Noted  . Pelvic pain affecting pregnancy 03/28/2015  . Pregnancy 03/20/2015  . Monochorionic diamniotic twin gestation in third trimester    She plans to breastfeed She desires condoms for postpartum contraception.   Prenatal labs and studies: ABO, Rh: A/Positive/-- (02/02 0000) Antibody: Negative (02/02 0000) Rubella: Nonimmune (02/02 0000) RPR: Nonreactive (02/02 0000)  HBsAg: Negative (02/02 0000)  HIV: Non-reactive (02/02 0000)  GTZG:YFVCBSWH(05/25 0000) 1 hr Glucola Normal Genetic screening normal Anatomy [place]abnormal with questionable valvular abnormality on twin A; [facility]  Maternal Diabetes: No Genetic Screening: Normal Maternal Ultrasounds/Referrals: Abnormal: Findings: Other:Fetal valvular abnormality, monitored, twin A Fetal Ultrasounds or other Referrals: Other: Pediatric cardiology assessed [facility] cardiac anatomy and [facility] Maternal Substance Abuse: No Significant Maternal Medications: None Significant Maternal Lab Results: None  Past Medical History  Diagnosis Date  . Kidney stone     macrobid daily  . Hematuria   . BRCA negative     [facility] brca 2 gene in family  . Ureteral colic     Past Surgical History  Procedure Laterality Date  . Tonsillectomy    . Wisdom tooth extraction       OB History  Gravida Para Term Preterm AB SAB TAB Ectopic Multiple Living  '2 1 1       2    ' # Outcome Date GA Lbr Len/2nd Weight Sex Delivery Anes PTL Lv  2 Current           1 Term 2010   7 lb 10 oz (3.459 kg) M Vag-Spont   Y    Obstetric Comments  Baby A-decreased movement.    History   Social History  . Marital Status: Married    Spouse Name: N/A  . Number of Children: N/A  . 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 Smoking status: Never Smoker   . Smokeless tobacco: Never Used  . Alcohol Use: No  . Drug Use: No  . Sexual Activity: No   Other Topics Concern  . None   Social History Narrative    Family History  Problem Relation Age of Onset  . Ovarian cancer Maternal Grandmother 30  . Breast cancer Maternal Grandmother 426 . Pancreatic cancer Mother   . Ovarian cancer Maternal Aunt 65  . Breast cancer Maternal Aunt   . Skin cancer Maternal Aunt      (Not in a hospital admission)  Allergies  Allergen Reactions  . Codeine Nausea Only    Review of Systems: Negative except for what is mentioned in HPI.  Physical Exam: BP 125/80 mmHg  Pulse 76  Wt 218 lb 5 oz (99.026 kg)  LMP 08/11/2014 (Exact Date) CONSTITUTIONAL: Well-developed, well-nourished female in no acute distress.  HENT: Normocephalic, atraumatic, External right and left ear normal. Oropharynx is clear and moist EYES: Conjunctivae and EOM are normal. Pupils are equal, round,  and reactive to light. No scleral icterus.  NECK: Normal range of motion, supple, no masses SKIN: Skin is warm and dry. No rash noted. Not diaphoretic. No erythema. No pallor. [place]: Alert and oriented to person, place, and time. Normal reflexes, muscle tone coordination. No cranial nerve deficit  noted. PSYCHIATRIC: Normal mood and affect. Normal behavior. Normal judgment and thought content. CARDIOVASCULAR: Normal heart rate noted, regular rhythm RESPIRATORY: Effort and breath sounds normal, no problems with respiration noted ABDOMEN: Soft, nontender, nondistended, gravid.Fundal height 41 cm MUSCULOSKELETAL: Normal range of motion. 2+ edema and no tenderness. 2+ distal pulses.  Cervical Exam: Dilatation 3.5cm Effacement 80% Station -1  Presentation: cephalic FHT: Baseline rate 128/141 bpm   Pertinent Labs/Studies:   Lab Results Last 24 Hours    Results for orders placed or performed in visit on 04/25/15 (from the past 24 hour(s))  POCT urinalysis dipstick Status: Abnormal   Collection Time: 04/25/15 2:50 PM  Result Value Ref Range   Color, UA yellow    Clarity, UA cloudy    Glucose, UA negative    Bilirubin, UA negative    Ketones, UA negative    Spec Grav, UA 1.010    Blood, UA moderate    pH, UA 6.0    Protein, UA negative    Urobilinogen, UA 0.2    Nitrite, UA negative    Leukocytes, UA small (1+) (A) Negative      Assessment : Anita Jacobs is a 29 y.o. G2P1002 at 32w5dwith mono/Di twins being admitted for labor Induction. Plan: Labor:Induction/Augmentation as needed, per protocol. FWB: Reassuring fetal heart tracing. GBS negative Epidural as needed. Delivery plan: Hopeful for vaginal delivery  Marie Borowski A. DZipporah Plants MD, FACOG [facility]         Expand All Collapse All   Obstetric History and Physical  LDemiraEMegin Jacobs a 29y.o. G2P1001 with Mono/di twinIUP at 331w5dresenting for Pitocin induction of labor. Patient states she has been having irregular, none vaginal bleeding, intact membranes, with active fetal movement.   Prenatal Course Source of Care: [facility] Pregnancy complications or risks: Patient  Active Problem List   Diagnosis Date Noted  . Pelvic pain affecting pregnancy 03/28/2015  . Pregnancy 03/20/2015  . Monochorionic diamniotic twin gestation in third trimester    She plans to breastfeed She desires condoms for postpartum contraception.   Prenatal labs and studies: ABO, Rh: A/Positive/-- (02/02 0000) Antibody: Negative (02/02 0000) Rubella: Nonimmune (02/02 0000) RPR: Nonreactive (02/02 0000)  HBsAg: Negative (02/02 0000)  HIV: Non-reactive (02/02 0000)  GBJKK:[id number]/25 0000) 1 hr Glucola Normal Genetic screening normal Anatomy US[place]oreabnormal with questionable valvular abnormality on twin A; [facility]  Maternal Diabetes: No Genetic Screening: Normal Maternal Ultrasounds/Referrals: Abnormal: Findings: Other:Fetal valvular abnormality, monitored, twin A Fetal Ultrasounds or other Referrals: Other: Pediatric cardiology assessed Fetal cardiac anatomy and [facility]: No Significant Maternal Medications: None Significant Maternal Lab Results: None  Past Medical History  Diagnosis Date  . Kidney stone     macrobid daily  . Hematuria   . BRCA negative     [facility] brca 2 [facility] in family  . Ureteral colic     Past Surgical History  Procedure Laterality Date  . Tonsillectomy    . Wisdom tooth extraction  OB History  Gravida Para Term Preterm AB SAB TAB Ectopic Multiple Living  '2 1 1       2    ' # Outcome Date GA Lbr Len/2nd Weight Sex Delivery Anes PTL Lv  2 Current           1 Term 2010   7 lb 10 oz (3.459 kg) M Vag-Spont   Y    Obstetric Comments  Baby A-decreased movement.    History   Social History  . Marital Status:  Married    Spouse Name: N/A  . Number of Children: N/A  . 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 Smoking status: Never Smoker   . Smokeless tobacco: Never Used  . Alcohol Use: No  . Drug Use: No  . Sexual Activity: No   Other Topics Concern  . None   Social History Narrative    Family History  Problem Relation Age of Onset  . Ovarian cancer Maternal Grandmother 30  . Breast cancer Maternal Grandmother 23  . Pancreatic cancer Mother   . Ovarian cancer Maternal Aunt 65  . Breast cancer Maternal Aunt   . Skin cancer Maternal Aunt      (Not in a hospital admission)  Allergies  Allergen Reactions  . Codeine Nausea Only    Review of Systems: Negative except for what is mentioned in HPI.  Physical Exam: BP 125/80 mmHg  Pulse 76  Wt 218 lb 5 oz (99.026 kg)  LMP 08/11/2014 (Exact Date) CONSTITUTIONAL: Well-developed, well-nourished female in no acute distress.  HENT: Normocephalic, atraumatic, External right and left ear normal. Oropharynx is clear and moist EYES: Conjunctivae and EOM are normal. Pupils are equal, round, and reactive to light. No scleral icterus.  NECK: Normal range of motion, supple, no masses SKIN: Skin is warm and dry. No rash noted. Not diaphoretic. No erythema. No pallor. Yoe: Alert and oriented to person, place, and time. Normal reflexes, muscle tone coordination. No cranial nerve deficit noted. PSYCHIATRIC: Normal mood and affect. Normal behavior. Normal judgment and thought content. CARDIOVASCULAR: Normal heart rate noted, regular rhythm RESPIRATORY: Effort and breath sounds normal, no problems with respiration noted ABDOMEN: Soft, nontender, nondistended, gravid.Fundal height 41 cm MUSCULOSKELETAL: Normal range of motion. 2+ edema and no tenderness. 2+ distal pulses.  Cervical Exam:  Dilatation 3.5cm Effacement 80% Station -1  Presentation: cephalic FHT: Baseline rate 128/141 bpm   Pertinent Labs/Studies:   Lab Results Last 24 Hours    Results for orders placed or performed in visit on 04/25/15 (from the past 24 hour(s))  POCT urinalysis dipstick Status: Abnormal   Collection Time: 04/25/15 2:50 PM  Result Value Ref Range   Color, UA yellow    Clarity, UA cloudy    Glucose, UA negative    Bilirubin, UA negative    Ketones, UA negative    Spec Grav, UA 1.010    Blood, UA moderate    pH, UA 6.0    Protein, UA negative    Urobilinogen, UA 0.2    Nitrite, UA negative    Leukocytes, UA small (1+) (A) Negative      Assessment : Selen Smucker is a 29 y.o. G2P1002 at 78w5dwith mono/Di twins being admitted for labor Induction. Plan: Labor:Induction/Augmentation as needed, per protocol. FWB: Reassuring fetal heart tracing. GBS negative Epidural as needed. Delivery plan: Hopeful for vaginal delivery  Mercer Stallworth A. DZipporah Plants  MD, FACOG [facility]  Updated Admission: No interval changes.

## 2015-04-27 LAB — CBC
HCT: 33 % — ABNORMAL LOW (ref 35.0–47.0)
Hemoglobin: 11.5 g/dL — ABNORMAL LOW (ref 12.0–16.0)
MCH: 32.1 pg (ref 26.0–34.0)
MCHC: 34.8 g/dL (ref 32.0–36.0)
MCV: 92.2 fL (ref 80.0–100.0)
PLATELETS: 175 10*3/uL (ref 150–440)
RBC: 3.58 MIL/uL — ABNORMAL LOW (ref 3.80–5.20)
RDW: 13.9 % (ref 11.5–14.5)
WBC: 12.3 10*3/uL — ABNORMAL HIGH (ref 3.6–11.0)

## 2015-04-27 LAB — URINE CULTURE: Organism ID, Bacteria: NO GROWTH

## 2015-04-27 LAB — RPR: RPR Ser Ql: NONREACTIVE

## 2015-04-27 MED ORDER — IBUPROFEN 800 MG PO TABS: 800.0000 mg | ORAL_TABLET | Freq: Three times a day (TID) | ORAL | Status: DC

## 2015-04-27 MED ORDER — OXYCODONE-ACETAMINOPHEN 5-325 MG PO TABS: 2.0000 | ORAL_TABLET | ORAL | Status: DC | PRN

## 2015-04-27 NOTE — Anesthesia Postprocedure Evaluation (Signed)
  Anesthesia Post-op Note  Patient: Anita Jacobs  Procedure(s) Performed: vaginal delivery  Anesthesia type: Epidural  Patient location: PACU  Post pain: Pain level controlled  Post assessment: Post-op Vital signs reviewed, Patient's Cardiovascular Status Stable, Respiratory Function Stable, Patent Airway and No signs of Nausea or vomiting  Post vital signs: Reviewed and stable  Last Vitals:  Filed Vitals:   04/27/15 0537  BP: 119/83  Pulse: 60  Temp: 36.4 C  Resp: 20    Level of consciousness: awake, alert  and patient cooperative  Complications: No apparent anesthesia complications

## 2015-04-27 NOTE — Discharge Summary (Signed)
Obstetric Discharge Summary Reason for Admission: induction of labor and Mono/Di Twins at 36.6 weeks Prenatal Procedures: NST and ultrasound Intrapartum Procedures: spontaneous vaginal delivery Postpartum Procedures: none Complications-Operative and Postpartum: vaginal laceration HEMOGLOBIN  Date Value Ref Range Status  04/27/2015 11.5* 12.0 - 16.0 g/dL Final   HCT  Date Value Ref Range Status  04/27/2015 33.0* 35.0 - 47.0 % Final    Physical Exam:  General: alert, cooperative and appears stated age 29: appropriate Uterine Fundus: firm Incision: healing well DVT Evaluation: No evidence of DVT seen on physical exam.  Discharge Diagnoses: Mono/Di Twins at 36.6 weeks, delivered  Discharge Information: Date: 04/27/2015 Activity: unrestricted Diet: routine Medications: PNV, Ibuprofen, Percocet and folic acid Condition: stable Instructions: refer to practice specific booklet Discharge to: home Follow-up Information    Follow up with COPLAND, ALICIA B, PA-C.   Specialty:  Physician Assistant   Contact information:   [address] [place] [address] [phone]       Follow up with COPLAND, ALICIA B, PA-C.   Specialty:  Physician Assistant   Contact information:   [address] [place] [address] [phone]       Follow up with Herold Harms, MD. Go in 6 weeks.   Specialties:  Obstetrics and Gynecology, Radiology   Why:  6 week Post Partum Check   Contact information:   [address] [place] [address] [phone]       Newborn Data:   Fara, Bonta [[id number]]  Live born female  Birth Weight: 6 lb 7.7 oz (2940 g) APGAR: 8, 9   Brileigh, Kramarz [[id number]]  Live born female  Birth Weight: 6 lb 5.2 oz (2869 g) APGAR: 8, 8  Home with per pediatrics.  Anita Jacobs 04/27/2015, 7:56 AM

## 2015-04-27 NOTE — Discharge Instructions (Addendum)
Home Care Instructions for Mom °After discharge, you may discover that you still have questions about body changes, activity, and care during the next few weeks. The following information should be helpful in answering many of your questions. °ACTIVITY °· Gradually resume your daily activities at home. °· Allow time for rest periods during the day and nap while your newborn sleeps. °· Avoid heavy lifting (more than 10 lb [4.5 kg]) and strenuous work or sports. Slow to moderate walking is usually safe. °· If you had a cesarean delivery, do not vacuum, climb stairs, or drive a car for 4 to 6 weeks. °· If you had a cesarean delivery, make arrangements for help at home until you feel you are okay to do your usual activities yourself. °· Ask your caregiver for safe exercises to do after delivery, especially if you had a cesarean delivery. °VAGINAL FLOW AND RETURN OF YOUR MENSTRUAL PERIOD °· Vaginal flow may continue for 4 to 6 weeks after delivery. °· Usually the amount decreases and the color of blood gets lighter. °· Bright red blood and an increased flow may reoccur if you have been too active. °· Lie down, raise (elevate) your feet, place a cold compress on your lower abdomen, rest, and call your caregiver if you are soaking more than 1 pad an hour or passing large clots. °· Menstrual periods will usually return 6 to 8 weeks after delivery. °· If you are breastfeeding, your period will return anywhere from 8 weeks to the time you stop breastfeeding. °PERINEAL CARE °· Use the peri bottle and change pads each time you go to the bathroom. °· Use towelettes in place of toilet paper until stitches are healed. °· Take warm tub baths for 15 to 20 minutes. °· Continue to use medicated pads or pain relieving spray. °· Lidocaine cream for episiotomy pain can be used with your caregiver's approval. °· Do not use tampons or douches until vaginal bleeding has stopped (about 4 weeks). °· Sexual intercourse should be avoided for at  least 3 to 4 weeks after delivery or until the brownish-red vaginal flow is completely gone. °· Wipe from front to back. °INCISION CARE (AFTER A CESAREAN DELIVERY) °· Shower as desired. Try to keep your incision dry. °BOWELS AND HEMORRHOIDS °· Drink at least 6 to 8 glasses of non-caffeinated fluids per day. °· Eat fiber-rich diet with whole grains, raw fruits, and vegetables. °· Take frequent warm tub baths if hemorrhoids are a problem. °· Avoid straining when having a bowel movement. °· Over-the-counter medicines and stool softeners can be used as directed by your caregiver. °NUTRITION °· Eat a well-balanced diet that includes the basic food groups. °· Do not try to lose weight quickly by cutting back on calories. °· If you are breastfeeding, drink at least 8 to 10 glasses of non-caffeinated fluid per day and increase your intake by 600 calories a day. °· Take your prenatal vitamins until your postpartum checkup or until your caregiver tells you to stop. °BREASTFEEDING °If you are not breastfeeding: °· Wear a good tight-fitting bra. °· Limit fluid intake after 1 or 2 days after delivery, or as directed, if your breasts are becoming engorged. °· Avoid nipple stimulation and apply cool (not icy cold) compresses to the breasts for comfort as needed. °¨ Avoid drinking alcohol and caffeinated drinks. °· Mild over-the-counter pain medication recommended by your caregiver is helpful for breast discomfort. °· Medications to dry up breast milk are not recommended. °If you are breastfeeding: °· Encourage   your newborn to breastfeed if you think he or she is hungry.  Wash your hands before breastfeeding.  Clean your breasts with warm water before nursing.  Start to encourage feeding 8 to 12 times a day for 10 to 15 minutes on each breast in the beginning to help stimulate milk production and train your newborn.  Avoid water and formula supplements for your newborn unless otherwise directed.  Have your newborn seen by  his or her caregiver 3 to 5 days after delivery and again at 2 to 3 weeks to evaluate his or her progress with breastfeeding.  Call your newborn's caregiver if you think he or she is not gaining weight or may be losing weight. POSTPARTUM BLUES Following delivery, your body goes through a drastic change in hormone levels. You may find yourself crying for no apparent reason and unable to cope with all the changes a newborn brings. Get support from your partner and friends. Give yourself time to adjust. If these feelings persist after several weeks, contact your caregiver or other professionals that can help you. Call your local emergency department, go to the emergency room, or get help right away from a relative, friend, or neighbor if you feel you are about to harm yourself, your newborn, or anyone else. EXERCISES Start Kegel exercises right after delivery. You can do it while standing, sitting, or lying down. Tighten your stomach muscles and the muscles surrounding your birth canal. Hold for a few seconds and then relax. Repeat 5 times each time. Make Kegel exercises a part of your daily routine to maintain the muscle tone that supports your vagina, bladder, and bowels. SELF BREAST EXAMINATION  Do breast self-exams at the same time of the month, each month.  Any lump, bump, or discharge should be reported to your caregiver.  Check your breasts, if you are breastfeeding, just after a feeding when your breasts are less full. If your period has started and you are breastfeeding, check your breasts on the fifth to seventh day of your period.  Breasts are normally lumpy if you are breastfeeding due to the fullness of the milk cells. This is temporary and not a health risk. INTIMACY AND SEXUALITY New parents need time to adjust to each other intimately and sexually after giving birth. Try to spend time as a couple discussing ways to adjust to your infant, your new schedule, and how to meet both your  desires and needs. Counseling can be helpful in deeply troubled cases. If you are breastfeeding or not yet having a menstrual period, you can get pregnant. Use some form of birth control to prevent getting pregnant. Talk to your caregiver about the birth control choices that are available to you for you situation. SEEK IMMEDIATE MEDICAL CARE IF:   Drainage increases from the Cesarean incision, episiotomy or tear site, or the drainage starts to smell bad.  You soak pads with blood in 1 hour or less. Home Care Instructions for Mom After discharge, you may discover that you still have questions about body changes, activity, and care during the next few weeks. The following information should be helpful in answering many of your questions. ACTIVITY Gradually resume your daily activities at home. Allow time for rest periods during the day and nap while your newborn sleeps. Avoid heavy lifting (more than 10 lb [4.5 kg]) and strenuous work or sports. Slow to moderate walking is usually safe. If you had a cesarean delivery, do not vacuum, climb stairs, or drive a car  for 4 to 6 weeks. If you had a cesarean delivery, make arrangements for help at home until you feel you are okay to do your usual activities yourself. Ask your caregiver for safe exercises to do after delivery, especially if you had a cesarean delivery. VAGINAL FLOW AND RETURN OF YOUR MENSTRUAL PERIOD Vaginal flow may continue for 4 to 6 weeks after delivery. Usually the amount decreases and the color of blood gets lighter. Bright red blood and an increased flow may reoccur if you have been too active. Lie down, raise (elevate) your feet, place a cold compress on your lower abdomen, rest, and call your caregiver if you are soaking more than 1 pad an hour or passing large clots. Menstrual periods will usually return 6 to 8 weeks after delivery. If you are breastfeeding, your period will return anywhere from 8 weeks to the time you stop  breastfeeding. PERINEAL CARE Use the peri bottle and change pads each time you go to the bathroom. Use towelettes in place of toilet paper until stitches are healed. Take warm tub baths for 15 to 20 minutes. Continue to use medicated pads or pain relieving spray. Lidocaine cream for episiotomy pain can be used with your caregiver's approval. Do not use tampons or douches until vaginal bleeding has stopped (about 4 weeks). Sexual intercourse should be avoided for at least 3 to 4 weeks after delivery or until the brownish-red vaginal flow is completely gone. Wipe from front to back. INCISION CARE (AFTER A CESAREAN DELIVERY) Shower as desired. Try to keep your incision dry. BOWELS AND HEMORRHOIDS Drink at least 6 to 8 glasses of non-caffeinated fluids per day. Eat fiber-rich diet with whole grains, raw fruits, and vegetables. Take frequent warm tub baths if hemorrhoids are a problem. Avoid straining when having a bowel movement. Over-the-counter medicines and stool softeners can be used as directed by your caregiver. NUTRITION Eat a well-balanced diet that includes the basic food groups. Do not try to lose weight quickly by cutting back on calories. If you are breastfeeding, drink at least 8 to 10 glasses of non-caffeinated fluid per day and increase your intake by 600 calories a day. Take your prenatal vitamins until your postpartum checkup or until your caregiver tells you to stop. BREASTFEEDING If you are not breastfeeding: Wear a good tight-fitting bra. Limit fluid intake after 1 or 2 days after delivery, or as directed, if your breasts are becoming engorged. Avoid nipple stimulation and apply cool (not icy cold) compresses to the breasts for comfort as needed. Avoid drinking alcohol and caffeinated drinks. Mild over-the-counter pain medication recommended by your caregiver is helpful for breast discomfort. Medications to dry up breast milk are not recommended. If you are  breastfeeding: Encourage your newborn to breastfeed if you think he or she is hungry. Wash your hands before breastfeeding. Clean your breasts with warm water before nursing. Start to encourage feeding 8 to 12 times a day for 10 to 15 minutes on each breast in the beginning to help stimulate milk production and train your newborn. Avoid water and formula supplements for your newborn unless otherwise directed. Have your newborn seen by his or her caregiver 3 to 5 days after delivery and again at 2 to 3 weeks to evaluate his or her progress with breastfeeding. Call your newborn's caregiver if you think he or she is not gaining weight or may be losing weight. POSTPARTUM BLUES Following delivery, your body goes through a drastic change in hormone levels. You may find  yourself crying for no apparent reason and unable to cope with all the changes a newborn brings. Get support from your partner and friends. Give yourself time to adjust. If these feelings persist after several weeks, contact your caregiver or other professionals that can help you. Call your local emergency department, go to the emergency room, or get help right away from a relative, friend, or neighbor if you feel you are about to harm yourself, your newborn, or anyone else. EXERCISES Start Kegel exercises right after delivery. You can do it while standing, sitting, or lying down. Tighten your stomach muscles and the muscles surrounding your birth canal. Hold for a few seconds and then relax. Repeat 5 times each time. Make Kegel exercises a part of your daily routine to maintain the muscle tone that supports your vagina, bladder, and bowels. SELF BREAST EXAMINATION Do breast self-exams at the same time of the month, each month. Any lump, bump, or discharge should be reported to your caregiver. Check your breasts, if you are breastfeeding, just after a feeding when your breasts are less full. If your period has started and you are  breastfeeding, check your breasts on the fifth to seventh day of your period. Breasts are normally lumpy if you are breastfeeding due to the fullness of the milk cells. This is temporary and not a health risk. INTIMACY AND SEXUALITY New parents need time to adjust to each other intimately and sexually after giving birth. Try to spend time as a couple discussing ways to adjust to your infant, your new schedule, and how to meet both your desires and needs. Counseling can be helpful in deeply troubled cases. If you are breastfeeding or not yet having a menstrual period, you can get pregnant. Use some form of birth control to prevent getting pregnant. Talk to your caregiver about the birth control choices that are available to you for you situation. SEEK IMMEDIATE MEDICAL CARE IF:  Drainage increases from the Cesarean incision, episiotomy or tear site, or the drainage starts to smell bad. You soak pads with blood in 1 hour or less. You have severe lower abdominal pain or cramping. You have a bad smelling vaginal discharge. You have increased rather than decreased pain around stitches or swelling, redness, or hardness in area. You have an oral temperature above 102 F (38.9 C), not controlled by medicine. You have pain or redness in the calf of the leg. You feel sick to your stomach (nausea) and throw up (vomit) for 12 hours. You have sudden, severe chest pain. You have shortness of breath. You have painful or bloody urination. You have visual problems. You develop a severe headache. An area of your breast is red and sore, and you have a fever. You may feel like you have flu symptoms. Document Released: 10/24/2000 Document Revised: 01/19/2012 Document Reviewed: 03/25/2010 [facility] Patient Information 2015 [place], [place]. This information is not intended to replace advice given to you by your health care provider. Make sure you discuss any questions you have with your health care provider.   You  have severe lower abdominal pain or cramping.  You have a bad smelling vaginal discharge.  You have increased rather than decreased pain around stitches or swelling, redness, or hardness in area.  You have an oral temperature above 102 F (38.9 C), not controlled by medicine.  You have pain or redness in the calf of the leg.  You feel sick to your stomach (nausea) and throw up (vomit) for 12 hours.  You have sudden, severe chest pain.  You have shortness of breath.  You have painful or bloody urination.  You have visual problems.  You develop a severe headache.  An area of your breast is red and sore, and you have a fever. You may feel like you have flu symptoms. Document Released: 10/24/2000 Document Revised: 01/19/2012 Document Reviewed: 03/25/2010 [facility] Patient Information 2015 [place], [place]. This information is not intended to replace advice given to you by your health care provider. Make sure you discuss any questions you have with your health care provider.  Home Care Instructions for Mom After discharge, you may discover that you still have questions about body changes, activity, and care during the next few weeks. The following information should be helpful in answering many of your questions. ACTIVITY  Gradually resume your daily activities at home.  Allow time for rest periods during the day and nap while your newborn sleeps.  Avoid heavy lifting (more than 10 lb [4.5 kg]) and strenuous work or sports. Slow to moderate walking is usually safe.  If you had a cesarean delivery, do not vacuum, climb stairs, or drive a car for 4 to 6 weeks.  If you had a cesarean delivery, make arrangements for help at home until you feel you are okay to do your usual activities yourself.  Ask your caregiver for safe exercises to do after delivery, especially if you had a cesarean delivery. VAGINAL FLOW AND RETURN OF YOUR MENSTRUAL PERIOD  Vaginal flow may continue for 4 to 6  weeks after delivery.  Usually the amount decreases and the color of blood gets lighter.  Bright red blood and an increased flow may reoccur if you have been too active.  Lie down, raise (elevate) your feet, place a cold compress on your lower abdomen, rest, and call your caregiver if you are soaking more than 1 pad an hour or passing large clots.  Menstrual periods will usually return 6 to 8 weeks after delivery.  If you are breastfeeding, your period will return anywhere from 8 weeks to the time you stop breastfeeding. PERINEAL CARE  Use the peri bottle and change pads each time you go to the bathroom.  Use towelettes in place of toilet paper until stitches are healed.  Take warm tub baths for 15 to 20 minutes.  Continue to use medicated pads or pain relieving spray.  Lidocaine cream for episiotomy pain can be used with your caregiver's approval.  Do not use tampons or douches until vaginal bleeding has stopped (about 4 weeks).  Sexual intercourse should be avoided for at least 3 to 4 weeks after delivery or until the brownish-red vaginal flow is completely gone.  Wipe from front to back. INCISION CARE (AFTER A CESAREAN DELIVERY)  Shower as desired. Try to keep your incision dry. BOWELS AND HEMORRHOIDS  Drink at least 6 to 8 glasses of non-caffeinated fluids per day.  Eat fiber-rich diet with whole grains, raw fruits, and vegetables.  Take frequent warm tub baths if hemorrhoids are a problem.  Avoid straining when having a bowel movement.  Over-the-counter medicines and stool softeners can be used as directed by your caregiver. NUTRITION  Eat a well-balanced diet that includes the basic food groups.  Do not try to lose weight quickly by cutting back on calories.  If you are breastfeeding, drink at least 8 to 10 glasses of non-caffeinated fluid per day and increase your intake by 600 calories a day.  Take your prenatal vitamins until  your postpartum checkup or until  your caregiver tells you to stop. BREASTFEEDING If you are not breastfeeding:  Wear a good tight-fitting bra.  Limit fluid intake after 1 or 2 days after delivery, or as directed, if your breasts are becoming engorged.  Avoid nipple stimulation and apply cool (not icy cold) compresses to the breasts for comfort as needed.  Avoid drinking alcohol and caffeinated drinks.  Mild over-the-counter pain medication recommended by your caregiver is helpful for breast discomfort.  Medications to dry up breast milk are not recommended. If you are breastfeeding:  Encourage your newborn to breastfeed if you think he or she is hungry.  Wash your hands before breastfeeding.  Clean your breasts with warm water before nursing.  Start to encourage feeding 8 to 12 times a day for 10 to 15 minutes on each breast in the beginning to help stimulate milk production and train your newborn.  Avoid water and formula supplements for your newborn unless otherwise directed.  Have your newborn seen by his or her caregiver 3 to 5 days after delivery and again at 2 to 3 weeks to evaluate his or her progress with breastfeeding.  Call your newborn's caregiver if you think he or she is not gaining weight or may be losing weight. POSTPARTUM BLUES Following delivery, your body goes through a drastic change in hormone levels. You may find yourself crying for no apparent reason and unable to cope with all the changes a newborn brings. Get support from your partner and friends. Give yourself time to adjust. If these feelings persist after several weeks, contact your caregiver or other professionals that can help you. Call your local emergency department, go to the emergency room, or get help right away from a relative, friend, or neighbor if you feel you are about to harm yourself, your newborn, or anyone else. EXERCISES Start Kegel exercises right after delivery. You can do it while standing, sitting, or lying down.  Tighten your stomach muscles and the muscles surrounding your birth canal. Hold for a few seconds and then relax. Repeat 5 times each time. Make Kegel exercises a part of your daily routine to maintain the muscle tone that supports your vagina, bladder, and bowels. SELF BREAST EXAMINATION  Do breast self-exams at the same time of the month, each month.  Any lump, bump, or discharge should be reported to your caregiver.  Check your breasts, if you are breastfeeding, just after a feeding when your breasts are less full. If your period has started and you are breastfeeding, check your breasts on the fifth to seventh day of your period.  Breasts are normally lumpy if you are breastfeeding due to the fullness of the milk cells. This is temporary and not a health risk. INTIMACY AND SEXUALITY New parents need time to adjust to each other intimately and sexually after giving birth. Try to spend time as a couple discussing ways to adjust to your infant, your new schedule, and how to meet both your desires and needs. Counseling can be helpful in deeply troubled cases. If you are breastfeeding or not yet having a menstrual period, you can get pregnant. Use some form of birth control to prevent getting pregnant. Talk to your caregiver about the birth control choices that are available to you for you situation. SEEK IMMEDIATE MEDICAL CARE IF:   Drainage increases from the Cesarean incision, episiotomy or tear site, or the drainage starts to smell bad.  You soak pads with blood in 1 hour  or less.  You have severe lower abdominal pain or cramping.  You have a bad smelling vaginal discharge.  You have increased rather than decreased pain around stitches or swelling, redness, or hardness in area.  You have an oral temperature above 102 F (38.9 C), not controlled by medicine.  You have pain or redness in the calf of the leg.  You feel sick to your stomach (nausea) and throw up (vomit) for 12  hours.  You have sudden, severe chest pain.  You have shortness of breath.  You have painful or bloody urination.  You have visual problems.  You develop a severe headache.  An area of your breast is red and sore, and you have a fever. You may feel like you have flu symptoms. Document Released: 10/24/2000 Document Revised: 01/19/2012 Document Reviewed: 03/25/2010 [facility] Patient Information 2015 [place], [place]. This information is not intended to replace advice given to you by your health care provider. Make sure you discuss any questions you have with your health care provider.  Call your doctor for increased pain or vaginal bleeding, temperature above 100.4, depression, or concerns.  No strenuous activity or heavy lifting for 6 weeks.  No intercourse, tampons, douching, or enemas for 6 weeks.  No tub baths-showers only.  No driving for 2 weeks or while taking pain medications.  Continue prenatal vitamin and iron.  Increase calories and fluids while breastfeeding.

## 2015-04-27 NOTE — Progress Notes (Signed)
Post Partum Day 1 Subjective: no complaints, up ad lib, voiding and tolerating PO  Objective: Blood pressure 128/78, pulse 68, temperature 97.5 F (36.4 C), temperature source Oral, resp. rate 18, height 5\' 3"  (1.6 m), weight 218 lb (98.884 kg), last menstrual period 08/11/2014, SpO2 99 %, unknown if currently breastfeeding.  Physical Exam:  General: alert and cooperative Lochia: appropriate Uterine Fundus: firm Incision: healing well DVT Evaluation: No evidence of DVT seen on physical exam.   Recent Labs  04/26/15 0800  HGB 12.1  HCT 36.0    Assessment/Plan: Breastfeeding, Lactation consult and Contraception condoms   LOS: 1 day   Anita Jacobs 04/27/2015, 7:39 AM

## 2015-04-28 NOTE — Progress Notes (Signed)
Post Partum Day 2 Subjective: no complaints, up ad lib, voiding and tolerating PO  Objective: Blood pressure 120/83, pulse 65, temperature 98.4 F (36.9 C), temperature source Oral, resp. rate 18, height 5\' 3"  (1.6 m), weight 98.884 kg (218 lb), last menstrual period 08/11/2014, SpO2 100 %, unknown if currently breastfeeding.  Physical Exam:  General: alert and cooperative Lochia: appropriate Uterine Fundus: firm Incision: healing well DVT Evaluation: No evidence of DVT seen on physical exam.  CBC Latest Ref Rng 04/27/2015 04/26/2015  WBC 3.6 - 11.0 K/uL 12.3(H) 10.0  Hemoglobin 12.0 - 16.0 g/dL 11.5(L) 12.1  Hematocrit 35.0 - 47.0 % 33.0(L) 36.0  Platelets 150 - 440 K/uL 175 223     Assessment/Plan: Discharge home, Breastfeeding and Contraception condoms  Desires circumcision for female infants.  Will have performed as outpatient.    LOS: 2 days   Hildred Laser 04/28/2015, 11:54 AM

## 2015-04-28 NOTE — Progress Notes (Signed)
Prenatal records indicate that pt received TDaP vaccine on 4/218/16. Reynold Bowen, RN 04/28/2015 2:39 PM

## 2015-04-28 NOTE — Progress Notes (Signed)
Discharge instructions provided.  Pt and sig other verbalize understanding of all instructions and follow-up care.  Prescription given.  Pt discharged to home with infants at 1518 on 04/28/15 via wheelchair by CNA. Reynold Bowen, RN 04/28/2015 3:24 PM

## 2015-05-02 ENCOUNTER — Telehealth: Payer: Self-pay

## 2015-05-02 NOTE — Telephone Encounter (Signed)
Pt called wanting to know if it was all right to have clots instead of liquid now was normal.  Pt informed yes, as long as she is not changing pad, clothing every 30 min to 1 hour. Also has not had BM since before delivery and suggested dulcolax supp. And if needed a Fleets enema. She is breast feeding. Having occasional headaches, taking Motrin and Pain medication and informed pt. This was okay. Monitor s/s such as blurred vision, dizziness, n/v, seeing spots to notify office. Stated feet have been slightly swollen although not today. Pt. Informed this was also normal and can get often worse. Pt. Appreciative of call.

## 2015-05-03 ENCOUNTER — Telehealth: Payer: Self-pay | Admitting: Obstetrics and Gynecology

## 2015-05-03 NOTE — Telephone Encounter (Signed)
Pt had twins last Thursday, she has internal hemrroids and she wanted to know if she can use anything to help, they are hurting really bad. Pt would like a call back

## 2015-05-03 NOTE — Telephone Encounter (Signed)
Pt states she is constipated. Only small bm since delivery 1 week ago. Thinks she has a  hemorrhoid. Taking narcotics only once a day. Ibup 800 q 8. Pos eating and drinking. Stool softener bid. NO abd pain. Advised increase h20, fresh fruits and veggies. Benefiber or miralax. Pericolace as needed. Anusol OTC, witch hazel, or tucks pads. 1/2 cup apple juice and 1/2 cup prune juice.  If no bm over the weekend she will need to be seen.

## 2015-05-22 ENCOUNTER — Telehealth: Payer: Self-pay | Admitting: Obstetrics and Gynecology

## 2015-05-22 NOTE — Telephone Encounter (Signed)
PT CALLED AND SHE IS BREAST FEEDING AND SHE THINKS SHE IS DEVELOPING MASTITIS, LEFT BREAST KNOT, FEVER ON AND OFF, AND CHILLS, ENGORGEMENT IN THE LEFT

## 2015-05-22 NOTE — Telephone Encounter (Signed)
TB asked me about this and I advised pt to make an appt first thing in the morning. Advised fluids,tylenol es, continue to breastfeed, and warm compress until she is seen. TB informed pt and appt was made.

## 2015-05-23 ENCOUNTER — Encounter: Payer: Self-pay | Admitting: Obstetrics and Gynecology

## 2015-05-23 ENCOUNTER — Ambulatory Visit (INDEPENDENT_AMBULATORY_CARE_PROVIDER_SITE_OTHER): Payer: 59 | Admitting: Obstetrics and Gynecology

## 2015-05-23 VITALS — BP 115/75 | HR 91 | Ht 63.0 in | Wt 181.2 lb

## 2015-05-23 DIAGNOSIS — N61 Inflammatory disorders of breast: Secondary | ICD-10-CM | POA: Diagnosis not present

## 2015-05-23 MED ORDER — DICLOXACILLIN SODIUM 500 MG PO CAPS: 500.0000 mg | ORAL_CAPSULE | Freq: Four times a day (QID) | ORAL | Status: DC

## 2015-05-23 NOTE — Progress Notes (Signed)
Patient ID: Anita Jacobs, female   DOB: 1986/01/28, 29 y.o.   MRN: [id number]  S: Breast pain left breast x 1 day  Fevers on/off Flu like sx  Pumping. Fever to 101 yesterday.  O:  No Adenopathy Lt Breast - 6x4 cm erythema/induration/no fluctuance  A: Lt Mastitis  P: 1. Dicloxacillin 500 mg QID x 14 days 2. Increase po fluids/tylenol 3. Continue pumping/nursing 4. Return 2 weeks

## 2015-05-23 NOTE — Patient Instructions (Signed)
1. Dicloxacillin 500 mg QID x 14 days  2. Increase po fluids/tylenol  3. Continue pumping/nursing  4. Return 2 weeks

## 2015-06-06 ENCOUNTER — Encounter: Payer: Self-pay | Admitting: Obstetrics and Gynecology

## 2015-06-06 ENCOUNTER — Ambulatory Visit (INDEPENDENT_AMBULATORY_CARE_PROVIDER_SITE_OTHER): Payer: Managed Care, Other (non HMO) | Admitting: Obstetrics and Gynecology

## 2015-06-06 DIAGNOSIS — N912 Amenorrhea, unspecified: Secondary | ICD-10-CM

## 2015-06-06 DIAGNOSIS — Z30011 Encounter for initial prescription of contraceptive pills: Secondary | ICD-10-CM

## 2015-06-06 MED ORDER — NORETHINDRONE 0.35 MG PO TABS: 1.0000 | ORAL_TABLET | Freq: Every day | ORAL | Status: DC

## 2015-06-06 NOTE — Patient Instructions (Signed)
1.  Resume all activities without restrictions. 2.  Start progestin only pill for contraception. 3.  Return in 6 months for annual exam. 4.  Postpartum blues screening is negative. 5.  Continue with prenatal vitamins while nursing. 6.  Notify us when supplementation of breastmilk is started so she can be converted to an estrogen-containing birth control pill.

## 2015-06-06 NOTE — Progress Notes (Signed)
Patient ID: Anita Jacobs, female   DOB: 25-Jan-1986, 29 y.o.   MRN: [id number] 6 week pp S/p svd 04/26/2015- twin boys Pumping-mastitis 2 weeks ago will complete diclox next few days- no issues with breast today B/c- mini pill [id number] Subjective:     Anita Jacobs is a 29 y.o. female who presents for a postpartum visit. She is 6 weeks postpartum following a SVD twins. I have fully reviewed the prenatal and intrapartum course. The delivery was at 38 gestational weeks. Outcome: spontaneous vaginal delivery. Anesthesia: epidural. Postpartum course has been Uneventful except for mastitis, treated. Baby's course has been Uneventful. Babies are feeding by breast. Bleeding no bleeding. Bowel function is normal. Bladder function is normal. Patient is not sexually active. Contraception method is oral progesterone-only contraceptive. Postpartum depression screening: negative.   Review of Systems per HPI  Objective:    BP 108/70 mmHg  Pulse 78  Ht  (1.6 m)  Wt 178 lb 11.2 oz (81.058 kg)  BMI 31.66 kg/m2  Breastfeeding? Yes  General:  alert and cooperative   Breasts:  lactation changes noted; mastitis, resolved  Lungs: normal  Heart:  normal  Abdomen: soft, non-tender; bowel sounds normal; no masses,  no organomegaly   Vulva:  normal  Vagina: normal vagina  Cervix:  no cervical motion tenderness and no lesions  Corpus: normal size, contour, position, consistency, mobility, non-tender  Adnexa:  no mass, fullness, tenderness  Rectal Exam: Not performed.        Assessment:     6 postpartum exam. Pap smear not done at today's visit.   Breast-feeding  Plan:    1. Contraception: oral progesterone-only contraceptive 2. Continue prenatal vitamins 3. Follow up in: 6 months or as needed.

## 2015-12-11 ENCOUNTER — Encounter: Payer: Managed Care, Other (non HMO) | Admitting: Obstetrics and Gynecology

## 2015-12-20 ENCOUNTER — Encounter: Payer: Managed Care, Other (non HMO) | Admitting: Obstetrics and Gynecology

## 2016-01-10 ENCOUNTER — Encounter: Payer: Managed Care, Other (non HMO) | Admitting: Obstetrics and Gynecology

## 2016-09-10 DIAGNOSIS — Z124 Encounter for screening for malignant neoplasm of cervix: Secondary | ICD-10-CM | POA: Diagnosis not present

## 2016-09-10 DIAGNOSIS — Z01419 Encounter for gynecological examination (general) (routine) without abnormal findings: Secondary | ICD-10-CM | POA: Diagnosis not present

## 2016-09-10 LAB — HM PAP SMEAR: HM Pap smear: NEGATIVE

## 2017-01-25 IMAGING — US US OB LIMITED
1 series · 14 of 28 positions shown · non-contrast
Comparison: none

[Series 1: us ob limited · 0.30mm/px · 14 of 72 slices shown]
[im 3/72]
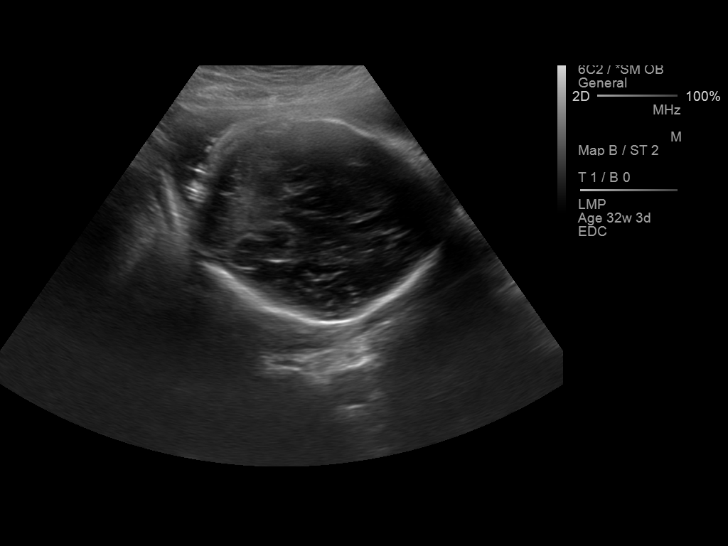
[im 8/72]
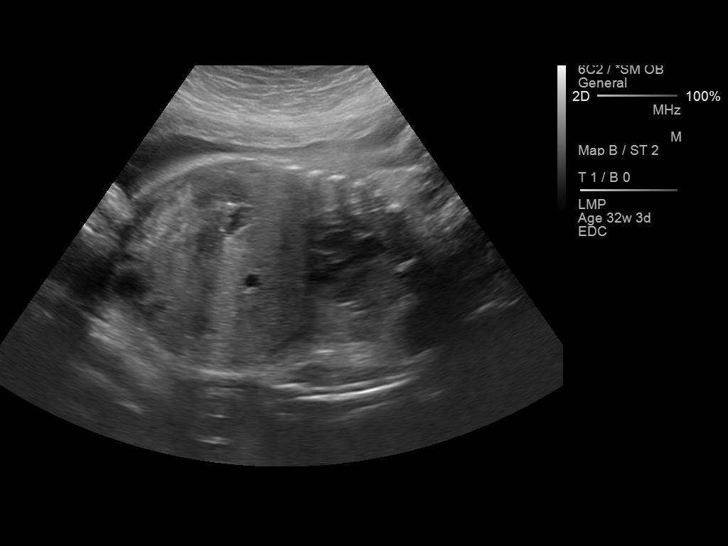
[im 14/72]
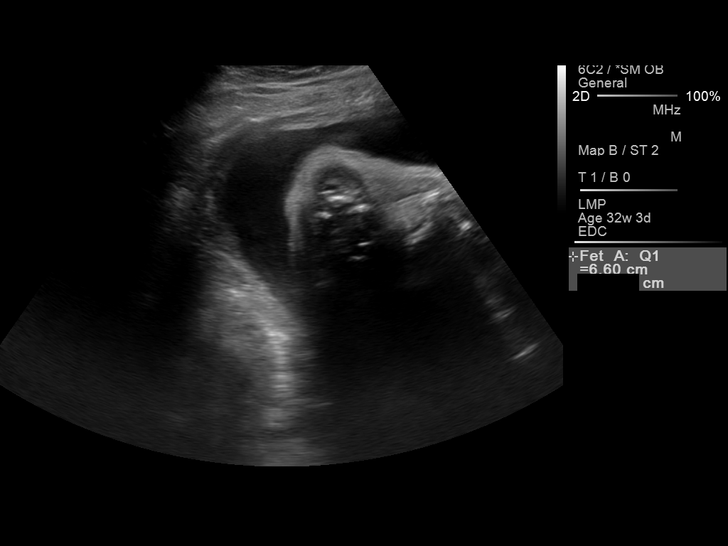
[im 19/72]
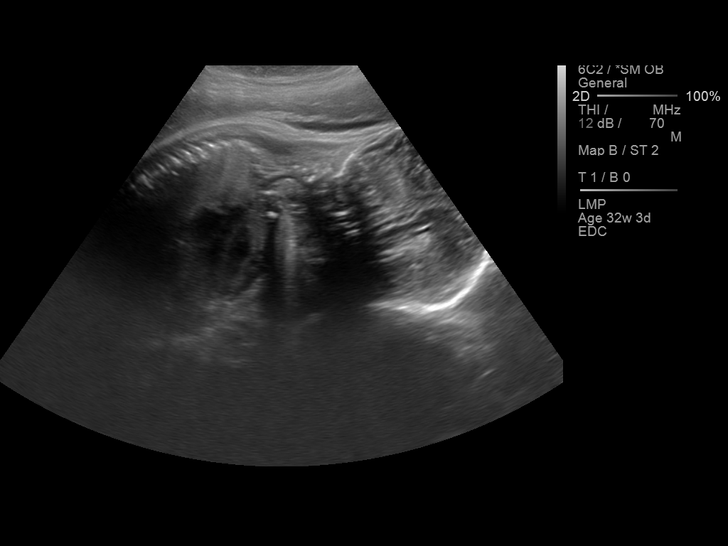
[im 24/72]
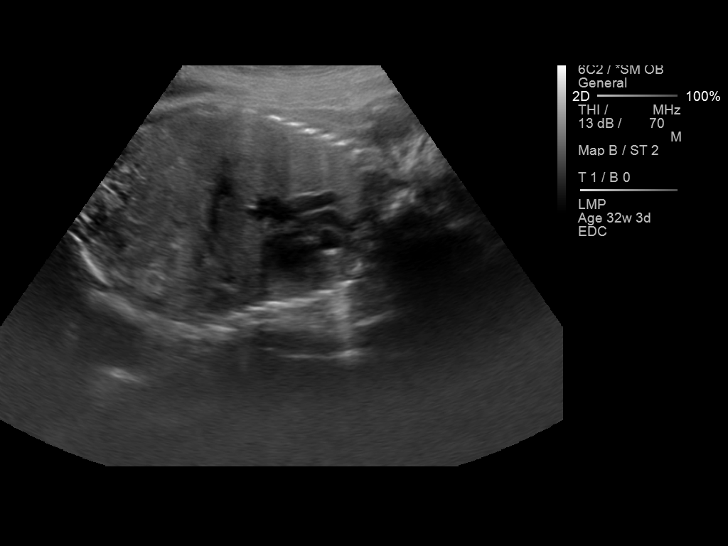
[im 29/72]
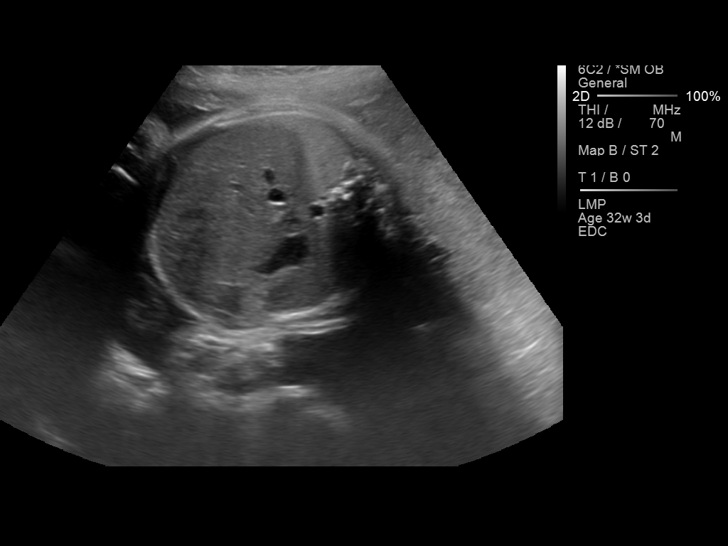
[im 35/72]
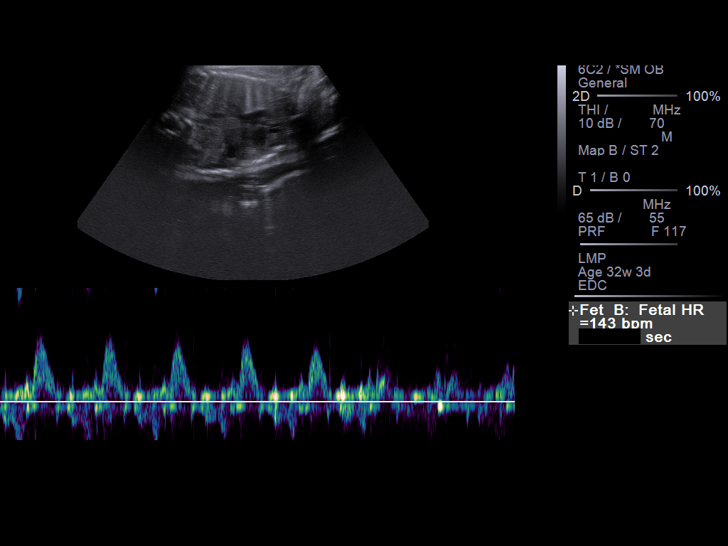
[im 40/72]
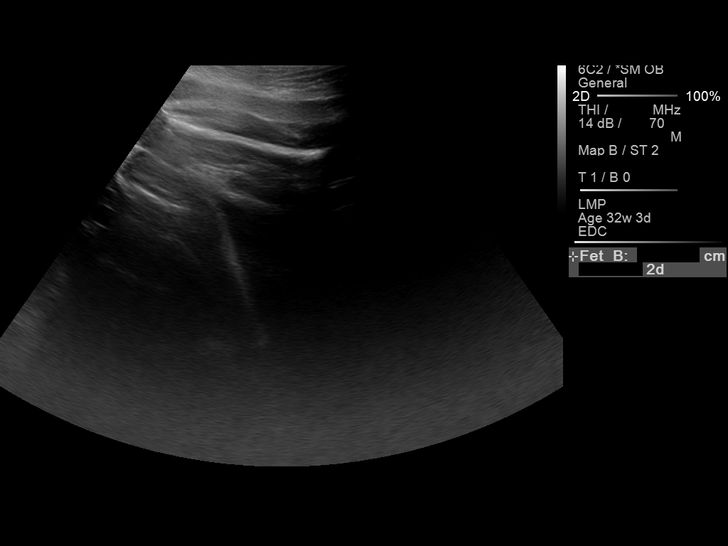
[im 45/72]
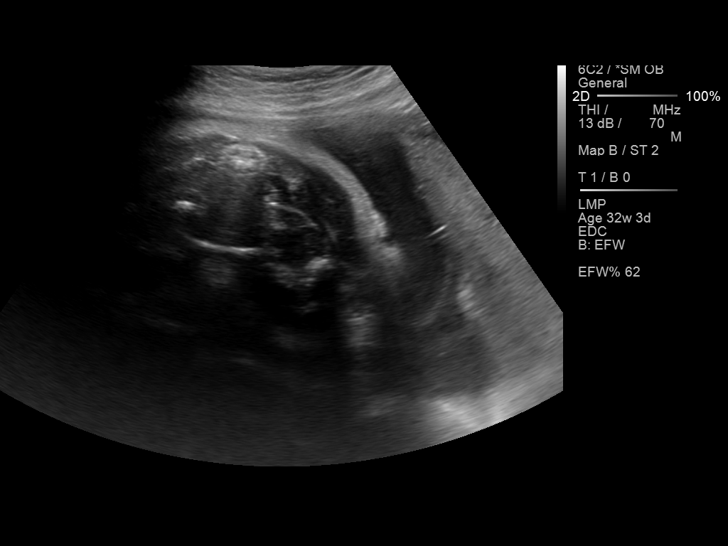
[im 50/72]
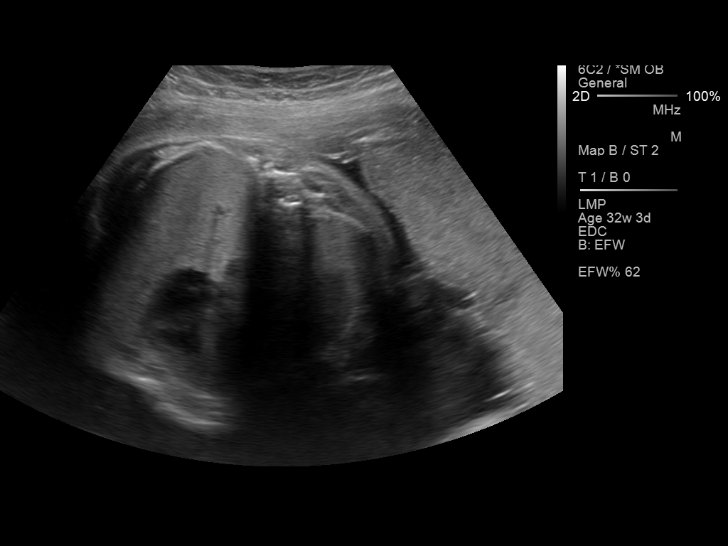
[im 56/72]
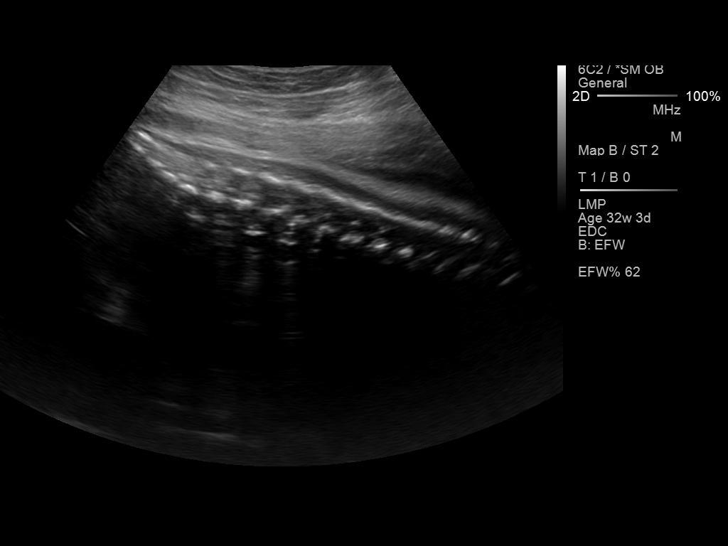
[im 61/72]
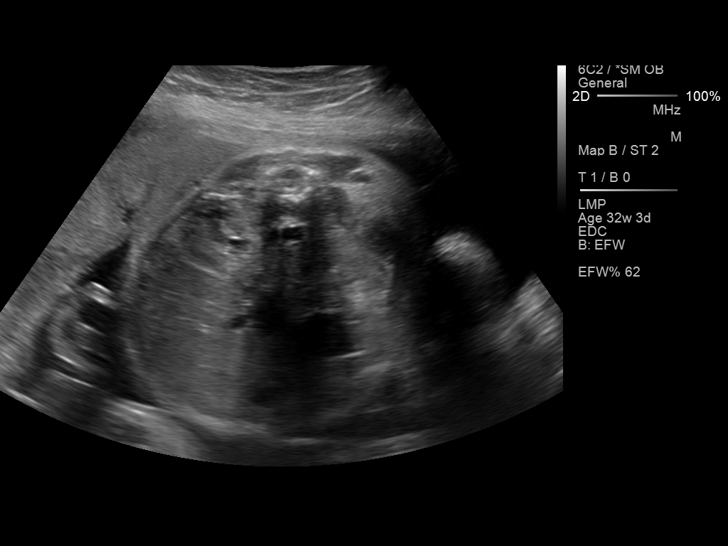
[im 66/72]
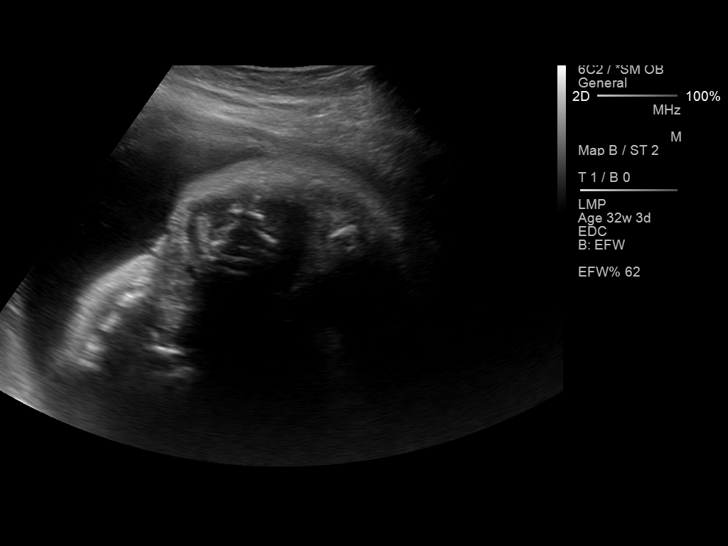
[im 72/72]
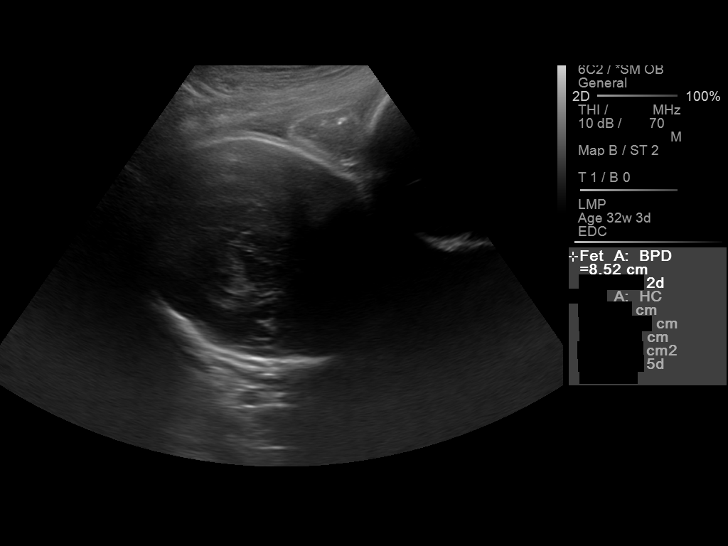

[14 of 28 positions shown; findings below may reference images not displayed]

Canned report from images found in remote index.

Refer to host system for actual result text.

## 2018-02-02 ENCOUNTER — Ambulatory Visit: Payer: Self-pay | Admitting: Medical

## 2018-02-02 VITALS — BP 132/84 | HR 77 | Temp 98.0°F | Resp 16 | Ht 63.0 in | Wt 189.0 lb

## 2018-02-02 DIAGNOSIS — H1032 Unspecified acute conjunctivitis, left eye: Secondary | ICD-10-CM

## 2018-02-02 MED ORDER — CIPROFLOXACIN HCL 0.3 % OP SOLN: OPHTHALMIC | 0 refills | Status: DC

## 2018-02-02 NOTE — Progress Notes (Signed)
   Subjective:    Patient ID: Anita Jacobs, female    DOB: [birth year], 32 y.o.   MRN: [id number]  HPI 32 yo in non acute distress. Comes in today with [employer] husband for pink eye in left eye.   Woke up last night with some crusting and worse this morning. 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 Does not know her normal visual acuity but says she is  "cannot without see contacts". No contact in Left eye today.   Review of Systems  Constitutional: Negative for chills and fever.  Eyes: Positive for pain ("discomfort"), discharge (yes this morning), redness, itching (mild) and visual disturbance (difficult to tell due to no contact in eye, does say she has very bad vision without her contacts/glasses.).  sore throat last  3 days in the mornings     Objective:   Physical Exam  Constitutional: She appears well-developed and well-nourished.  HENT:  Head: Normocephalic and atraumatic.  Eyes: Pupils are equal, round, and reactive to light. EOM are normal. Right eye exhibits no discharge. Left eye exhibits no discharge. Right conjunctiva is injected (mild lateral). Left conjunctiva is injected.    erythema right nostril , no edema. Left nostril pale and no swelling.   cobble stoning posterior pharynx   Visual acuity  20/40 both 20/30 right with contact [phone] left without contact Assessment & Plan:  Conjunctivitis Bilateral L>R Allergic Rhinitis OTC Flonase take as directed. Remove contacts and wear glasses.Throw contacts away and any make up used yesterday and today. Patient last pregnant  3 years ago and not breast feeding. Denies any current pregnancy. Meds ordered this encounter  Medications  . ciprofloxacin (CILOXAN) 0.3 % ophthalmic solution    Sig: Administer 1 drop, every 4 hour into both eyes while awake, for the next 7 days.    Dispense:  5 mL    Refill:  0  Reviewed hygiene and how to place drops into eye. No  contacts till finished treatment, wear glasses  ( she does have a pair). Return to the clinic in 3-5 days if not improving.

## 2018-02-02 NOTE — Patient Instructions (Signed)
Allergic Rhinitis, Adult Allergic rhinitis is an allergic reaction that affects the mucous membrane inside the nose. It causes sneezing, a runny or stuffy nose, and the feeling of mucus going down the back of the throat (postnasal drip). Allergic rhinitis can be mild to severe. There are two types of allergic rhinitis:  Seasonal. This type is also called hay fever. It happens only during certain seasons.  Perennial. This type can happen at any time of the year.  What are the causes? This condition happens when the body's defense system (immune system) responds to certain harmless substances called allergens as though they were germs.  Seasonal allergic rhinitis is triggered by pollen, which can come from grasses, trees, and weeds. Perennial allergic rhinitis may be caused by:  House dust mites.  Pet dander.  Mold spores.  What are the signs or symptoms? Symptoms of this condition include:  Sneezing.  Runny or stuffy nose (nasal congestion).  Postnasal drip.  Itchy nose.  Tearing of the eyes.  Trouble sleeping.  Daytime sleepiness.  How is this diagnosed? This condition may be diagnosed based on:  Your medical history.  A physical exam.  Tests to check for related conditions, such as: ? Asthma. ? Pink eye. ? Ear infection. ? Upper respiratory infection.  Tests to find out which allergens trigger your symptoms. These may include skin or blood tests.  How is this treated? There is no cure for this condition, but treatment can help control symptoms. Treatment may include:  Taking medicines that block allergy symptoms, such as antihistamines. Medicine may be given as a shot, nasal spray, or pill.  Avoiding the allergen.  Desensitization. This treatment involves getting ongoing shots until your body becomes less sensitive to the allergen. This treatment may be done if other treatments do not help.  If taking medicine and avoiding the allergen does not work, new,  stronger medicines may be prescribed.  Follow these instructions at home:  Find out what you are allergic to. Common allergens include smoke, dust, and pollen.  Avoid the things you are allergic to. These are some things you can do to help avoid allergens: ? Replace carpet with wood, tile, or vinyl flooring. Carpet can trap dander and dust. ? Do not smoke. Do not allow smoking in your home. ? Change your heating and air conditioning filter at least once a month. ? During allergy season:  Keep windows closed as much as possible.  Plan outdoor activities when pollen counts are lowest. This is usually during the evening hours.  When coming indoors, change clothing and shower before sitting on furniture or bedding.  Take over-the-counter and prescription medicines only as told by your health care provider.  Keep all follow-up visits as told by your health care provider. This is important. Contact a health care provider if:  You have a fever.  You develop a persistent cough.  You make whistling sounds when you breathe (you wheeze).  Your symptoms interfere with your normal daily activities. Get help right away if:  You have shortness of breath. Summary  This condition can be managed by taking medicines as directed and avoiding allergens.  Contact your health care provider if you develop a persistent cough or fever.  During allergy season, keep windows closed as much as possible. This information is not intended to replace advice given to you by your health care provider. Make sure you discuss any questions you have with your health care provider. Document Released: 07/22/2001 Document Revised: 12/04/2016  Document Reviewed: 12/04/2016 Elsevier Interactive Patient Education  2018 [employer] Bacterial Conjunctivitis Bacterial conjunctivitis is an infection of your conjunctiva. This is the clear membrane that covers the white part of your eye and the inner surface of your  eyelid. This condition can make your eye:  Red or pink.  Itchy.  This condition is caused by bacteria. This condition spreads very easily from person to person (is contagious) and from one eye to the other eye. Follow these instructions at home: Medicines  Take or apply your antibiotic medicine as told by your doctor. Do not stop taking or applying the antibiotic even if you start to feel better.  Take or apply over-the-counter and prescription medicines only as told by your doctor.  Do not touch your eyelid with the eye drop bottle or the ointment tube. Managing discomfort  Wipe any fluid from your eye with a warm, wet washcloth or a cotton ball.  Place a cool, clean washcloth on your eye. Do this for 10-20 minutes, 3-4 times per day. General instructions  Do not wear contact lenses until the irritation is gone. Wear glasses until your doctor says it is okay to wear contacts.  Do not wear eye makeup until your symptoms are gone. Throw away any old makeup.  Change or wash your pillowcase every day.  Do not share towels or washcloths with anyone.  Wash your hands often with soap and water. Use paper towels to dry your hands.  Do not touch or rub your eyes.  Do not drive or use heavy machinery if your vision is blurry. Contact a doctor if:  You have a fever.  Your symptoms do not get better after 10 days. Get help right away if:  You have a fever and your symptoms suddenly get worse.  You have very bad pain when you move your eye.  Your face: ? Hurts. ? Is red. ? Is swollen.  You have sudden loss of vision. This information is not intended to replace advice given to you by your health care provider. Make sure you discuss any questions you have with your health care provider. Document Released: 08/05/2008 Document Revised: 04/03/2016 Document Reviewed: 08/09/2015 Elsevier Interactive Patient Education  2018 [employer] Inc.  

## 2018-03-23 ENCOUNTER — Ambulatory Visit: Payer: BLUE CROSS/BLUE SHIELD | Admitting: Internal Medicine

## 2018-03-23 ENCOUNTER — Encounter: Payer: Self-pay | Admitting: Internal Medicine

## 2018-03-23 VITALS — BP 120/76 | HR 69 | Temp 98.1°F | Ht 63.0 in | Wt 193.2 lb

## 2018-03-23 DIAGNOSIS — E559 Vitamin D deficiency, unspecified: Secondary | ICD-10-CM

## 2018-03-23 DIAGNOSIS — E669 Obesity, unspecified: Secondary | ICD-10-CM

## 2018-03-23 DIAGNOSIS — D649 Anemia, unspecified: Secondary | ICD-10-CM

## 2018-03-23 DIAGNOSIS — Z0184 Encounter for antibody response examination: Secondary | ICD-10-CM

## 2018-03-23 DIAGNOSIS — Z1329 Encounter for screening for other suspected endocrine disorder: Secondary | ICD-10-CM

## 2018-03-23 DIAGNOSIS — F329 Major depressive disorder, single episode, unspecified: Secondary | ICD-10-CM | POA: Diagnosis not present

## 2018-03-23 DIAGNOSIS — Z13220 Encounter for screening for lipoid disorders: Secondary | ICD-10-CM | POA: Diagnosis not present

## 2018-03-23 DIAGNOSIS — F419 Anxiety disorder, unspecified: Secondary | ICD-10-CM | POA: Diagnosis not present

## 2018-03-23 DIAGNOSIS — L659 Nonscarring hair loss, unspecified: Secondary | ICD-10-CM | POA: Diagnosis not present

## 2018-03-23 DIAGNOSIS — Z1389 Encounter for screening for other disorder: Secondary | ICD-10-CM

## 2018-03-23 DIAGNOSIS — F32A Depression, unspecified: Secondary | ICD-10-CM

## 2018-03-23 DIAGNOSIS — Z1159 Encounter for screening for other viral diseases: Secondary | ICD-10-CM | POA: Diagnosis not present

## 2018-03-23 NOTE — Patient Instructions (Addendum)
Schedule fasting labs  Reduce tension on your hair and Giovannis tea tree shampoo as well as multivitamin for women or prenatal    Buspirone tablets What is this medicine? BUSPIRONE (byoo SPYE rone) is used to treat anxiety disorders. This medicine may be used for other purposes; ask your health care provider or pharmacist if you have questions. COMMON BRAND NAME(S): BuSpar What should I tell my health care provider before I take this medicine? They need to know if you have any of these conditions: -kidney or liver disease -an unusual or allergic reaction to buspirone, other medicines, foods, dyes, or preservatives -pregnant or trying to get pregnant -breast-feeding How should I use this medicine? Take this medicine by mouth with a glass of water. Follow the directions on the prescription label. You may take this medicine with or without food. To ensure that this medicine always works the same way for you, you should take it either always with or always without food. Take your doses at regular intervals. Do not take your medicine more often than directed. Do not stop taking except on the advice of your doctor or health care professional. Talk to your pediatrician regarding the use of this medicine in children. Special care may be needed. Overdosage: If you think you have taken too much of this medicine contact a poison control center or emergency room at once. NOTE: This medicine is only for you. Do not share this medicine with others. What if I miss a dose? If you miss a dose, take it as soon as you can. If it is almost time for your next dose, take only that dose. Do not take double or extra doses. What may interact with this medicine? Do not take this medicine with any of the following medications: -linezolid -MAOIs like Carbex, Eldepryl, Marplan, Nardil, and Parnate -methylene blue -procarbazine This medicine may also interact with the following  medications: -diazepam -digoxin -diltiazem -erythromycin -grapefruit juice -haloperidol -medicines for mental depression or mood problems -medicines for seizures like carbamazepine, phenobarbital and phenytoin -nefazodone -other medications for anxiety -rifampin -ritonavir -some antifungal medicines like itraconazole, ketoconazole, and voriconazole -verapamil -warfarin This list may not describe all possible interactions. Give your health care provider a list of all the medicines, herbs, non-prescription drugs, or dietary supplements you use. Also tell them if you smoke, drink alcohol, or use illegal drugs. Some items may interact with your medicine. What should I watch for while using this medicine? Visit your doctor or health care professional for regular checks on your progress. It may take 1 to 2 weeks before your anxiety gets better. You may get drowsy or dizzy. Do not drive, use machinery, or do anything that needs mental alertness until you know how this drug affects you. Do not stand or sit up quickly, especially if you are an older patient. This reduces the risk of dizzy or fainting spells. Alcohol can make you more drowsy and dizzy. Avoid alcoholic drinks. What side effects may I notice from receiving this medicine? Side effects that you should report to your doctor or health care professional as soon as possible: -blurred vision or other vision changes -chest pain -confusion -difficulty breathing -feelings of hostility or anger -muscle aches and pains -numbness or tingling in hands or feet -ringing in the ears -skin rash and itching -vomiting -weakness Side effects that usually do not require medical attention (report to your doctor or health care professional if they continue or are bothersome): -disturbed dreams, nightmares -headache -nausea -restlessness or  nervousness -sore throat and nasal congestion -stomach upset This list may not describe all possible side  effects. Call your doctor for medical advice about side effects. You may report side effects to FDA at 1-800-FDA-1088. Where should I keep my medicine? Keep out of the reach of children. Store at room temperature below 30 degrees C (86 degrees F). Protect from light. Keep container tightly closed. Throw away any unused medicine after the expiration date. NOTE: This sheet is a summary. It may not cover all possible information. If you have questions about this medicine, talk to your doctor, pharmacist, or health care provider.  2018 Elsevier/Gold Standard (2010-06-06 18:06:11)  Exercising to Lose Weight Exercising can help you to lose weight. In order to lose weight through exercise, you need to do vigorous-intensity exercise. You can tell that you are exercising with vigorous intensity if you are breathing very hard and fast and cannot hold a conversation while exercising. Moderate-intensity exercise helps to maintain your current weight. You can tell that you are exercising at a moderate level if you have a higher heart rate and faster breathing, but you are still able to hold a conversation. How often should I exercise? Choose an activity that you enjoy and set realistic goals. Your health care provider can help you to make an activity plan that works for you. Exercise regularly as directed by your health care provider. This may include:  Doing resistance training twice each week, such as: ? Push-ups. ? Sit-ups. ? Lifting weights. ? Using resistance bands.  Doing a given intensity of exercise for a given amount of time. Choose from these options: ? 150 minutes of moderate-intensity exercise every week. ? 75 minutes of vigorous-intensity exercise every week. ? A mix of moderate-intensity and vigorous-intensity exercise every week.  Children, pregnant women, people who are out of shape, people who are overweight, and older adults may need to consult a health care provider for individual  recommendations. If you have any sort of medical condition, be sure to consult your health care provider before starting a new exercise program. What are some activities that can help me to lose weight?  Walking at a rate of at least 4.5 miles an hour.  Jogging or running at a rate of 5 miles per hour.  Biking at a rate of at least 10 miles per hour.  Lap swimming.  Roller-skating or in-line skating.  Cross-country skiing.  Vigorous competitive sports, such as football, basketball, and soccer.  Jumping rope.  Aerobic dancing. How can I be more active in my day-to-day activities?  Use the stairs instead of the elevator.  Take a walk during your lunch break.  If you drive, park your car farther away from work or school.  If you take public transportation, get off one stop early and walk the rest of the way.  Make all of your phone calls while standing up and walking around.  Get up, stretch, and walk around every 30 minutes throughout the day. What guidelines should I follow while exercising?  Do not exercise so much that you hurt yourself, feel dizzy, or get very short of breath.  Consult your health care provider prior to starting a new exercise program.  Wear comfortable clothes and shoes with good support.  Drink plenty of water while you exercise to prevent dehydration or heat stroke. Body water is lost during exercise and must be replaced.  Work out until you breathe faster and your heart beats faster. This information is not  intended to replace advice given to you by your health care provider. Make sure you discuss any questions you have with your health care provider. Document Released: 11/29/2010 Document Revised: 04/03/2016 Document Reviewed: 03/30/2014 Elsevier Interactive Patient Education  [employer].

## 2018-03-23 NOTE — Progress Notes (Signed)
Pre visit review using our clinic review tool, if applicable. No additional management support is needed unless otherwise documented below in the visit note. 

## 2018-03-23 NOTE — Progress Notes (Addendum)
Chief Complaint  Patient presents with  . Establish Care   New patient  1. C/o hair loss crown of scalp esp in shower nothing tried  2. C/o anxiety/depression [facility] had her on zoloft 50 mg qd but made her fuzzy and tired so stopped  3. Obesity BMI >34 not exercising she is picky eater and eats a lot of carbs disc exercise and healthy diet choices 1st     Review of Systems  Constitutional: Negative for weight loss.  HENT: Negative for hearing loss.   Eyes: Negative for blurred vision.  Respiratory: Negative for shortness of breath.   Cardiovascular: Negative for chest pain.  Musculoskeletal: Negative for falls.  Skin: Negative for rash.       +hair loss   Psychiatric/Behavioral: Positive for depression. The patient is nervous/anxious.    Past Medical History:  Diagnosis Date  . BRCA negative    family study at [facility]- Dr. Victoria Seewaldt pos brca 2 gene in family  . Hematuria   . Kidney stone    macrobid daily  . Ureteral colic    Past Surgical History:  Procedure Laterality Date  . TONSILLECTOMY    . WISDOM TOOTH EXTRACTION     Family History  Problem Relation Age of Onset  . Ovarian cancer Maternal Grandmother 30  . Breast cancer Maternal Grandmother 45  . Pancreatic cancer Mother   . Ovarian cancer Maternal Aunt 65  . Breast cancer Maternal Aunt   . Skin cancer Maternal Aunt    Social History   Socioeconomic History  . Marital status: Married    Spouse name: Not on file  . Number of children: Not on file  . Years of education: Not on file  . Highest education level: Not on file  Occupational History  . 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 Financial resource strain: Not on file  . Food insecurity:    Worry: Not on file    Inability: Not on file  . Transportation needs:    Medical: Not on file    Non-medical: Not on file  Tobacco Use  . Smoking status: Never Smoker  . Smokeless tobacco: Never Used  Substance and Sexual Activity  .  Alcohol use: No  . Drug use: No  . Sexual activity: Never    Birth control/protection: None  Lifestyle  . Physical activity:    Days per week: Not on file    Minutes per session: Not on file  . Stress: Not on file  Relationships  . Social connections:    Talks on phone: Not on file    Gets together: Not on file    Attends religious service: Not on file    Active member of club or organization: Not on file    Attends meetings of clubs or organizations: Not on file    Relationship status: Not on file  . Intimate partner violence:    Fear of current or ex partner: Not on file    Emotionally abused: Not on file    Physically abused: Not on file    Forced sexual activity: Not on file  Other Topics Concern  . Not on file  Social History Narrative  . Not on file   No outpatient medications have been marked as taking for the 03/23/18 encounter (Office Visit) with McLean-Scocuzza,  N, MD.   Allergies  Allergen Reactions  . Codeine Nausea Only   No results found for this or any previous visit (from   the past 2160 hour(s)). Objective  Body mass index is 34.22 kg/m. Wt Readings from Last 3 Encounters:  03/23/18 193 lb 3.2 oz (87.6 kg)  02/02/18 189 lb (85.7 kg)  06/06/15 178 lb 11.2 oz (81.1 kg)   Temp Readings from Last 3 Encounters:  03/23/18 98.1 F (36.7 C) (Oral)  02/02/18 98 F (36.7 C) (Tympanic)  04/28/15 98.9 F (37.2 C) (Oral)   BP Readings from Last 3 Encounters:  03/23/18 120/76  02/02/18 132/84  06/06/15 108/70   Pulse Readings from Last 3 Encounters:  03/23/18 69  02/02/18 77  06/06/15 78    Physical Exam  Constitutional: She is oriented to person, place, and time. Vital signs are normal. She appears well-developed and well-nourished. She is cooperative.  HENT:  Head: Normocephalic and atraumatic.  Mouth/Throat: Oropharynx is clear and moist and mucous membranes are normal.  Eyes: Pupils are equal, round, and reactive to light. Conjunctivae are  normal.  Cardiovascular: Normal rate, regular rhythm and normal heart sounds.  Pulmonary/Chest: Effort normal and breath sounds normal.  Neurological: She is alert and oriented to person, place, and time. Gait normal.  Skin: Skin is warm, dry and intact.  2 strands out on pull test   Psychiatric: She has a normal mood and affect. Her speech is normal and behavior is normal. Judgment and thought content normal. Cognition and memory are normal.  Nursing note and vitals reviewed.   Assessment   1. Hair loss could be stress related vs tension vs other no discrete alopecia areata AA 2. Anxiety/depression  3. Obesity BMI >34  4. HM Plan   1.  Disc giovanni tea tree shampoo  Check labs  Mvt/prenatal vitamins  Reduce tension  2. Disc meditation, exercise wants to hold on therapy  Disc buspar as well  Tried zoloft didn't like the way It made her feel  3. rec exercise and healthy diet choices  4.  Had flu shot  tdap had /18/16  Check hep B status declines MMR   [facility]ap [facility] 2017/2018 get records. Had 09/10/16 neg pap no HPV testing done [facility]  [facility]  Dentist Dr. Greeson    Provider: Dr.  McLean-Scocuzza-Internal Medicine  

## 2018-03-31 ENCOUNTER — Encounter: Payer: Self-pay | Admitting: Internal Medicine

## 2018-04-08 ENCOUNTER — Other Ambulatory Visit (INDEPENDENT_AMBULATORY_CARE_PROVIDER_SITE_OTHER): Payer: BLUE CROSS/BLUE SHIELD

## 2018-04-08 DIAGNOSIS — F419 Anxiety disorder, unspecified: Secondary | ICD-10-CM | POA: Diagnosis not present

## 2018-04-08 DIAGNOSIS — D649 Anemia, unspecified: Secondary | ICD-10-CM | POA: Diagnosis not present

## 2018-04-08 DIAGNOSIS — F329 Major depressive disorder, single episode, unspecified: Secondary | ICD-10-CM | POA: Diagnosis not present

## 2018-04-08 DIAGNOSIS — Z1159 Encounter for screening for other viral diseases: Secondary | ICD-10-CM | POA: Diagnosis not present

## 2018-04-08 DIAGNOSIS — Z1329 Encounter for screening for other suspected endocrine disorder: Secondary | ICD-10-CM | POA: Diagnosis not present

## 2018-04-08 DIAGNOSIS — Z1389 Encounter for screening for other disorder: Secondary | ICD-10-CM

## 2018-04-08 DIAGNOSIS — E559 Vitamin D deficiency, unspecified: Secondary | ICD-10-CM

## 2018-04-08 DIAGNOSIS — Z13220 Encounter for screening for lipoid disorders: Secondary | ICD-10-CM

## 2018-04-08 DIAGNOSIS — F32A Depression, unspecified: Secondary | ICD-10-CM

## 2018-04-08 DIAGNOSIS — Z0184 Encounter for antibody response examination: Secondary | ICD-10-CM

## 2018-04-08 LAB — T4, FREE: Free T4: 0.8 ng/dL (ref 0.60–1.60)

## 2018-04-08 LAB — LIPID PANEL
CHOL/HDL RATIO: 2
Cholesterol: 120 mg/dL (ref 0–200)
HDL: 49.6 mg/dL (ref >39.00)
LDL CALC: 58 mg/dL (ref 0–99)
NONHDL: 69.99
TRIGLYCERIDES: 62 mg/dL (ref 0.0–149.0)
VLDL: 12.4 mg/dL (ref 0.0–40.0)

## 2018-04-08 LAB — CBC WITH DIFFERENTIAL/PLATELET
BASOS ABS: 0 10*3/uL (ref 0.0–0.1)
Basophils Relative: 0.7 % (ref 0.0–3.0)
EOS ABS: 0.2 10*3/uL (ref 0.0–0.7)
Eosinophils Relative: 2.5 % (ref 0.0–5.0)
HCT: 40.1 % (ref 36.0–46.0)
Hemoglobin: 13.8 g/dL (ref 12.0–15.0)
LYMPHS ABS: 1.3 10*3/uL (ref 0.7–4.0)
Lymphocytes Relative: 21.4 % (ref 12.0–46.0)
MCHC: 34.4 g/dL (ref 30.0–36.0)
MCV: 91.6 fl (ref 78.0–100.0)
Monocytes Absolute: 0.5 10*3/uL (ref 0.1–1.0)
Monocytes Relative: 8.2 % (ref 3.0–12.0)
NEUTROS ABS: 4.2 10*3/uL (ref 1.4–7.7)
NEUTROS PCT: 67.2 % (ref 43.0–77.0)
PLATELETS: 323 10*3/uL (ref 150.0–400.0)
RBC: 4.37 Mil/uL (ref 3.87–5.11)
RDW: 13.3 % (ref 11.5–15.5)
WBC: 6.2 10*3/uL (ref 4.0–10.5)

## 2018-04-08 LAB — COMPREHENSIVE METABOLIC PANEL
ALT: 7 U/L (ref 0–35)
AST: 14 U/L (ref 0–37)
Albumin: 4.1 g/dL (ref 3.5–5.2)
Alkaline Phosphatase: 29 U/L — ABNORMAL LOW (ref 39–117)
BILIRUBIN TOTAL: 0.5 mg/dL (ref 0.2–1.2)
BUN: 11 mg/dL (ref 6–23)
CO2: 25 mEq/L (ref 19–32)
CREATININE: 0.76 mg/dL (ref 0.40–1.20)
Calcium: 9 mg/dL (ref 8.4–10.5)
Chloride: 103 mEq/L (ref 96–112)
GFR: 95.1 mL/min (ref >60.00)
GLUCOSE: 82 mg/dL (ref 70–99)
Potassium: 4.5 mEq/L (ref 3.5–5.1)
Sodium: 135 mEq/L (ref 135–145)
TOTAL PROTEIN: 7 g/dL (ref 6.0–8.3)

## 2018-04-08 LAB — TSH: TSH: 0.35 u[IU]/mL (ref 0.35–4.50)

## 2018-04-08 LAB — VITAMIN D 25 HYDROXY (VIT D DEFICIENCY, FRACTURES): VITD: 24.52 ng/mL — ABNORMAL LOW (ref 30.00–100.00)

## 2018-04-09 ENCOUNTER — Telehealth: Payer: Self-pay | Admitting: Internal Medicine

## 2018-04-09 LAB — IRON,TIBC AND FERRITIN PANEL
%SAT: 23 % (calc) (ref 11–50)
FERRITIN: 25 ng/mL (ref 10–154)
IRON: 77 ug/dL (ref 40–190)
TIBC: 329 mcg/dL (calc) (ref 250–450)

## 2018-04-09 LAB — URINALYSIS, ROUTINE W REFLEX MICROSCOPIC
BILIRUBIN UA: NEGATIVE
Glucose, UA: NEGATIVE
Leukocytes, UA: NEGATIVE
NITRITE UA: NEGATIVE
Protein, UA: NEGATIVE
RBC, UA: NEGATIVE
SPEC GRAV UA: 1.005 (ref 1.005–1.030)
Urobilinogen, Ur: 0.2 mg/dL (ref 0.2–1.0)
pH, UA: 6 (ref 5.0–7.5)

## 2018-04-09 LAB — HEPATITIS B SURFACE ANTIBODY, QUANTITATIVE: Hepatitis B-Post: <5 m[IU]/mL — ABNORMAL LOW (ref >=10)

## 2018-04-09 NOTE — Telephone Encounter (Signed)
Copied from CRM [id number]. Topic: Quick Communication - Lab Results >> Apr 09, 2018  1:53 PM Bronwen BettersBooth, Brock T, CMA wrote:  Called patient to inform them of 31may2019 lab results. When patient returns call, triage nurse may disclose results.  >> Apr 09, 2018  2:51 PM Elliot GaultBell, Tiffany M wrote:  Relation to pt: self Call back [phone] (M) Pharmacy: [facility] [id number] - [place], [place] - [address] [phone] (Phone) [phone] (Fax)    Reason for call:   Patient returning call regarding lab results, please advise

## 2018-04-29 ENCOUNTER — Ambulatory Visit: Payer: BLUE CROSS/BLUE SHIELD | Admitting: Internal Medicine

## 2018-10-05 ENCOUNTER — Ambulatory Visit: Payer: Self-pay | Admitting: Medical

## 2020-06-02 DIAGNOSIS — Z03818 Encounter for observation for suspected exposure to other biological agents ruled out: Secondary | ICD-10-CM | POA: Diagnosis not present

## 2020-06-02 DIAGNOSIS — Z20822 Contact with and (suspected) exposure to covid-19: Secondary | ICD-10-CM | POA: Diagnosis not present

## 2020-09-12 ENCOUNTER — Ambulatory Visit: Payer: BLUE CROSS/BLUE SHIELD | Admitting: Obstetrics and Gynecology

## 2020-10-23 ENCOUNTER — Ambulatory Visit: Payer: Self-pay | Admitting: Obstetrics and Gynecology

## 2020-11-28 ENCOUNTER — Encounter: Payer: Self-pay | Admitting: Obstetrics and Gynecology

## 2020-11-28 NOTE — Patient Instructions (Signed)
I value your feedback and you entrusting us with your care. If you get a [facility] patient survey, I would appreciate you taking the time to let us know about your experience today. Thank you! ? ? ?

## 2020-11-28 NOTE — Progress Notes (Signed)
PCP:  McLean-Scocuzza, Nino Glow, MD   Chief Complaint  Patient presents with  . Gynecologic Exam    No concerns     HPI:      Ms. Anita Jacobs is a 35 y.o. [phone] whose LMP was Patient's last menstrual period was 11/23/2020 (exact date)., presents today for her NP> 3 yrs annual examination.  Her menses are regular every 28-30 days, lasting 3-5 days.  Dysmenorrhea none. She does not have intermenstrual bleeding.  Sex activity: single partner, contraception - condoms, declines other BC Last Pap: 09/10/16  Results were: no abnormalities   There is a strong FH of breast cancer. There is a FH of ovarian cancer in her mat aunt. Her mom was BRCA 2 positive and pt is BRCA neg. Family followed at [facility]. The patient does do self-breast exams.  Tobacco use: The patient denies current or previous tobacco use. Alcohol use: none No drug use.  Exercise: moderately active  She does get adequate calcium but not Vitamin D in her diet. Labs with PCP  Past Medical History:  Diagnosis Date  . Anxiety   . BRCA negative    family study at [place]- Dr. Pearlie Oyster pos brca 2 gene in family  . Chicken pox   . Depression   . Family history of BRCA2 gene positive    mother  . Hair loss   . Hematuria   . Kidney stone    macrobid daily  . Obesity (BMI 35.0-39.9 without comorbidity)   . Ureteral colic     Past Surgical History:  Procedure Laterality Date  . TONSILLECTOMY     2007  . WISDOM TOOTH EXTRACTION      Family History  Problem Relation Age of Onset  . Ovarian cancer Maternal Grandmother 30  . Breast cancer Maternal Grandmother 25  . Early death Maternal Grandmother   . Pancreatic cancer Mother   . COPD Mother        pancreatitc cancer BRCA2 +  . Early death Mother   . Ovarian cancer Maternal Aunt 65  . Breast cancer Maternal Aunt        Great Aunts  . Skin cancer Maternal Aunt   . Hyperlipidemia Father   . Diabetes Maternal Grandfather   . Diabetes  Paternal Grandmother   . Alcohol abuse Paternal Grandfather   . Breast cancer Cousin     Social History   Socioeconomic History  . Marital status: Married    Spouse name: Not on file  . Number of children: Not on file  . Years of education: Not on file  . Highest education level: Not on file  Occupational History  . 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 Smoking status: Never Smoker  . Smokeless tobacco: Never Used  Vaping Use  . Vaping Use: Never used  Substance and Sexual Activity  . Alcohol use: No  . Drug use: No  . Sexual activity: Yes    Birth control/protection: None  Other Topics Concern  . Not on file  Social History Narrative   Married    3 kids husband Engineer, structural    2 kids identical twin boys 2016 born and 1 son 61 y.o    Aeronautical engineer degree    Owns guns    Safe in relationship    Wearing seat belt    Social Determinants of Health   Financial Resource Strain: Not on [employer]  Insecurity: Not on file  Transportation Needs: Not on file  Physical Activity: Not on file  Stress: Not on file  Social Connections: Not on file  Intimate Partner Violence: Not on file    No current outpatient medications on file.     ROS:  Review of Systems  Constitutional: Negative for fatigue, fever and unexpected weight change.  Respiratory: Negative for cough, shortness of breath and wheezing.   Cardiovascular: Negative for chest pain, palpitations and leg swelling.  Gastrointestinal: Negative for blood in stool, constipation, diarrhea, nausea and vomiting.  Endocrine: Negative for cold intolerance, heat intolerance and polyuria.  Genitourinary: Negative for dyspareunia, dysuria, flank pain, frequency, genital sores, hematuria, menstrual problem, pelvic pain, urgency, vaginal bleeding, vaginal discharge and vaginal pain.  Musculoskeletal: Negative for back pain, joint swelling and myalgias.  Skin: Negative for rash.   Neurological: Negative for dizziness, syncope, light-headedness, numbness and headaches.  Hematological: Negative for adenopathy.  Psychiatric/Behavioral: Negative for agitation, confusion, sleep disturbance and suicidal ideas. The patient is not nervous/anxious.    BREAST: No symptoms   Objective: BP 120/88   Ht 5' 3" (1.6 m)   Wt 204 lb (92.5 kg)   LMP 11/23/2020 (Exact Date)   Breastfeeding No   BMI 36.14 kg/m    Physical Exam Constitutional:      Appearance: She is well-developed.  Genitourinary:     Vulva normal.     Right Labia: No rash, tenderness or lesions.    Left Labia: No tenderness, lesions or rash.    No vaginal discharge, erythema or tenderness.      Right Adnexa: not tender and no mass present.    Left Adnexa: not tender and no mass present.    No cervical friability or polyp.     Uterus is not enlarged or tender.  Breasts:     Right: No mass, nipple discharge, skin change or tenderness.     Left: No mass, nipple discharge, skin change or tenderness.    Neck:     Thyroid: No thyromegaly.  Cardiovascular:     Rate and Rhythm: Normal rate and regular rhythm.     Heart sounds: Normal heart sounds. No murmur heard.   Pulmonary:     Effort: Pulmonary effort is normal.     Breath sounds: Normal breath sounds.  Abdominal:     Palpations: Abdomen is soft.     Tenderness: There is no abdominal tenderness. There is no guarding or rebound.  Musculoskeletal:        General: Normal range of motion.     Cervical back: Normal range of motion.  Lymphadenopathy:     Cervical: No cervical adenopathy.  Neurological:     General: No focal deficit present.     Mental Status: She is alert and oriented to person, place, and time.     Cranial Nerves: No cranial nerve deficit.  Skin:    General: Skin is warm and dry.  Psychiatric:        Mood and Affect: Mood normal.        Behavior: Behavior normal.        Thought Content: Thought content normal.         Judgment: Judgment normal.  Vitals reviewed.     Assessment/Plan: Encounter for annual routine gynecological examination  Cervical cancer screening - Plan: Cytology - PAP  Screening for HPV (human papillomavirus) - Plan: Cytology - PAP  Family history of breast cancer--FH BRCA 2 gene positive, pt is BRCA neg.  GYN counsel adequate intake of calcium and vitamin D, diet and exercise     F/U  Return in about 1 year (around 11/29/2021).  Jihan Rudy B. Andriy Sherk, PA-C 11/29/2020 9:23 AM

## 2020-11-29 ENCOUNTER — Ambulatory Visit (INDEPENDENT_AMBULATORY_CARE_PROVIDER_SITE_OTHER): Payer: BC Managed Care – PPO | Admitting: Obstetrics and Gynecology

## 2020-11-29 ENCOUNTER — Other Ambulatory Visit (HOSPITAL_COMMUNITY)
Admission: RE | Admit: 2020-11-29 | Discharge: 2020-11-29 | Disposition: A | Discharge status: Home / Self Care | Payer: BLUE CROSS/BLUE SHIELD | Source: Ambulatory Visit | Attending: Obstetrics and Gynecology | Admitting: Obstetrics and Gynecology

## 2020-11-29 ENCOUNTER — Encounter: Payer: Self-pay | Admitting: Obstetrics and Gynecology

## 2020-11-29 ENCOUNTER — Other Ambulatory Visit: Payer: Self-pay

## 2020-11-29 VITALS — BP 120/88 | Ht 63.0 in | Wt 204.0 lb

## 2020-11-29 DIAGNOSIS — Z1151 Encounter for screening for human papillomavirus (HPV): Secondary | ICD-10-CM | POA: Diagnosis not present

## 2020-11-29 DIAGNOSIS — Z01419 Encounter for gynecological examination (general) (routine) without abnormal findings: Secondary | ICD-10-CM | POA: Diagnosis not present

## 2020-11-29 DIAGNOSIS — Z803 Family history of malignant neoplasm of breast: Secondary | ICD-10-CM

## 2020-11-29 DIAGNOSIS — Z124 Encounter for screening for malignant neoplasm of cervix: Secondary | ICD-10-CM | POA: Diagnosis not present

## 2020-11-30 LAB — CYTOLOGY - PAP
Comment: NEGATIVE
Diagnosis: NEGATIVE
High risk HPV: NEGATIVE

## 2021-10-16 ENCOUNTER — Ambulatory Visit: Payer: BC Managed Care – PPO | Admitting: Internal Medicine

## 2021-10-16 ENCOUNTER — Other Ambulatory Visit: Payer: Self-pay

## 2021-10-16 ENCOUNTER — Ambulatory Visit (INDEPENDENT_AMBULATORY_CARE_PROVIDER_SITE_OTHER): Payer: BC Managed Care – PPO

## 2021-10-16 ENCOUNTER — Encounter: Payer: Self-pay | Admitting: Internal Medicine

## 2021-10-16 VITALS — BP 122/84 | HR 83 | Temp 97.4°F | Ht 63.0 in | Wt 205.4 lb

## 2021-10-16 DIAGNOSIS — M5441 Lumbago with sciatica, right side: Secondary | ICD-10-CM

## 2021-10-16 DIAGNOSIS — F419 Anxiety disorder, unspecified: Secondary | ICD-10-CM

## 2021-10-16 DIAGNOSIS — F410 Panic disorder [episodic paroxysmal anxiety] without agoraphobia: Secondary | ICD-10-CM

## 2021-10-16 DIAGNOSIS — R7982 Elevated C-reactive protein (CRP): Secondary | ICD-10-CM

## 2021-10-16 DIAGNOSIS — Z0000 Encounter for general adult medical examination without abnormal findings: Secondary | ICD-10-CM

## 2021-10-16 DIAGNOSIS — M791 Myalgia, unspecified site: Secondary | ICD-10-CM | POA: Diagnosis not present

## 2021-10-16 DIAGNOSIS — G8929 Other chronic pain: Secondary | ICD-10-CM | POA: Diagnosis not present

## 2021-10-16 DIAGNOSIS — Z1389 Encounter for screening for other disorder: Secondary | ICD-10-CM

## 2021-10-16 DIAGNOSIS — F339 Major depressive disorder, recurrent, unspecified: Secondary | ICD-10-CM

## 2021-10-16 DIAGNOSIS — M2550 Pain in unspecified joint: Secondary | ICD-10-CM | POA: Diagnosis not present

## 2021-10-16 DIAGNOSIS — Z1329 Encounter for screening for other suspected endocrine disorder: Secondary | ICD-10-CM

## 2021-10-16 DIAGNOSIS — E559 Vitamin D deficiency, unspecified: Secondary | ICD-10-CM

## 2021-10-16 DIAGNOSIS — R770 Abnormality of albumin: Secondary | ICD-10-CM

## 2021-10-16 DIAGNOSIS — M722 Plantar fascial fibromatosis: Secondary | ICD-10-CM

## 2021-10-16 DIAGNOSIS — M545 Low back pain, unspecified: Secondary | ICD-10-CM | POA: Diagnosis not present

## 2021-10-16 DIAGNOSIS — E538 Deficiency of other specified B group vitamins: Secondary | ICD-10-CM

## 2021-10-16 DIAGNOSIS — Z13220 Encounter for screening for lipoid disorders: Secondary | ICD-10-CM

## 2021-10-16 HISTORY — DX: Other chronic pain: G89.29

## 2021-10-16 MED ORDER — HYDROXYZINE HCL 25 MG PO TABS: 25.0000 mg | ORAL_TABLET | Freq: Every day | ORAL | 3 refills | Status: DC | PRN

## 2021-10-16 MED ORDER — CITALOPRAM HYDROBROMIDE 10 MG PO TABS: 10.0000 mg | ORAL_TABLET | Freq: Every day | ORAL | 3 refills | Status: DC

## 2021-10-16 NOTE — Progress Notes (Addendum)
Chief Complaint  Patient presents with   Leg Pain   F/u reestablish care  1. Right leg pain with chronic back pain lifting heavy golf bags since Friday 3/10 and generalized aches and pains legs R>L and b/l arms new  Pain from right back rad down right buttock and had lightening sharp pain right heel  Nothing tried  2. Panic attack anxiety with chest tightness _0 07   WISDOM TOOTH EXTRACTION     Family History  Problem Relation Age of Onset   Cancer Mother        pancreatic ca dx 26 BRCA +   Pancreatic cancer Mother    COPD Mother        pancreatitc cancer BRCA2 +   Early death Mother    Hypertension Father     Hyperlipidemia Father    Hypertension Brother    Ovarian cancer Maternal Grandmother 40   Breast cancer Maternal Grandmother 69   Early death Maternal Grandmother    Diabetes Maternal Grandfather    Diabetes Paternal Grandmother    Alcohol abuse Paternal Grandfather    Ovarian cancer Maternal Aunt 65   Breast cancer Maternal Aunt        Great Aunts   Skin cancer Maternal Aunt    Hypothyroidism Maternal Aunt    Breast cancer Cousin    Social History   Socioeconomic History   Marital status: Married    Spouse name: Not on file   Number of children: Not on file   Years of education: Not on file   Highest education level: Not on file  Occupational History   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 [employer]   Tobacco Use   Smoking status: Never   Smokeless tobacco: Never  Vaping Use   Vaping Use: Never used  Substance and Sexual Activity   Alcohol use: No   Drug use: No   Sexual activity: Yes    Birth control/protection: None  Other Topics Concern  Not on file  Social History Narrative   Married    Played golf for years    3 kids husband Engineer, structural    2 kids identical twin boys 2016 born and 40 son 58 y.o    -as of 10/2021 38 y.o and 2 twin 33 y.o boys       Undergraduate degree    Owns guns    Safe in relationship    Wearing seat belt    10/2021 Richardson Chiquito    Social Determinants of Health   Financial Resource Strain: Not on file  Food Insecurity: Not on file  Transportation Needs: Not on file  Physical Activity: Not on file  Stress: Not on file  Social Connections: Not on file  Intimate Partner Violence: Not on file   Current Meds  Medication Sig   citalopram (CELEXA) 10 MG tablet Take 1 tablet (10 mg total) by mouth daily.   hydrOXYzine (ATARAX) 25 MG tablet Take 1 tablet (25 mg total) by mouth daily as needed.   Allergies  Allergen Reactions   Codeine Nausea Only   No results found for this or any previous visit (from the past  2160 hour(s)). Objective  Body mass index is 36.38 kg/m. Wt Readings from Last 3 Encounters:  10/16/21 205 lb 6.4 oz (93.2 kg)  11/29/20 204 lb (92.5 kg)  03/23/18 193 lb 3.2 oz (87.6 kg)   Temp Readings from Last 3 Encounters:  10/16/21 (!) 97.4 F (36.3 C) (Temporal)  03/23/18 98.1 F (36.7 C) (Oral)  02/02/18 98 F (36.7 C) (Tympanic)   BP Readings from Last 3 Encounters:  10/16/21 122/84  11/29/20 120/88  03/23/18 120/76   Pulse Readings from Last 3 Encounters:  10/16/21 83  03/23/18 69  02/02/18 77    Physical Exam Vitals and nursing note reviewed.  Constitutional:      Appearance: Normal appearance. She is well-developed and well-groomed.  HENT:     Head: Normocephalic and atraumatic.  Eyes:     Conjunctiva/sclera: Conjunctivae normal.     Pupils: Pupils are equal, round, and reactive to light.  Cardiovascular:     Rate and Rhythm: Normal rate and regular rhythm.     Heart sounds: Normal heart sounds. No murmur heard. Pulmonary:     Effort: Pulmonary effort is normal.     Breath sounds: Normal breath sounds.  Abdominal:     General: Abdomen is flat. Bowel sounds are normal.     Tenderness: There is no abdominal tenderness.  Musculoskeletal:        General: No tenderness.  Skin:    General: Skin is warm and dry.  Neurological:     General: No focal deficit present.     Mental Status: She is alert and oriented to person, place, and time. Mental status is at baseline.     Cranial Nerves: Cranial nerves 2-12 are intact.     Gait: Gait is intact.  Psychiatric:        Attention and Perception: Attention and perception normal.        Mood and Affect: Mood and affect normal.        Speech: Speech normal.        Behavior: Behavior normal. Behavior is cooperative.        Thought Content: Thought content normal.        Cognition and Memory: Cognition and memory normal.        Judgment: Judgment normal.    Assessment  Plan  Depression, recurrent ([place]) -  Plan: citalopram (CELEXA) 10 MG tablet  Anxiety/panic- Plan: citalopram (CELEXA) 10 MG tablet, hydrOXYzine (ATARAX) 25 MG tablet Declines therapy for now  Polyarthralgia - Plan: CBC w/Diff, Comprehensive metabolic panel, Sedimentation rate, CRP High sensitivity, Antinuclear Antib (ANA), Rheumatoid Factor, CYCLIC CITRUL PEPTIDE ANTIBODY, IGG/IGA, CANCELED: Comprehensive metabolic panel, CANCELED: CBC w/Diff  Myalgia - Plan: CBC w/Diff, Comprehensive metabolic panel, Sedimentation rate, CRP High sensitivity, Antinuclear Antib (ANA), Rheumatoid Factor, CYCLIC CITRUL PEPTIDE ANTIBODY, IGG/IGA, CANCELED: Comprehensive metabolic panel, CANCELED: CBC w/Diff    Vitamin D deficiency - Plan: Vitamin D (25 hydroxy    Chronic right-sided low back pain with right-sided sciatica - Plan: DG Lumbar Spine Complete    HM-CPE next visit Basic labs had at work 10/17/21 cmet, cbc, lipid, tsh, t4, b12, ua   Flu shot declines 10/16/21  tdap due 2026  Moderna 2/2 declines 2/2  Check hep B status declines MMR    Pap [facility] 2017/2018 get records. Had 11/29/20 neg pap negHPV testing done [facility]  Mammogram 42  Colonoscopy 52  Skin denies for now  [facility]  Dentist Dr. Carman Ching  Provider: Dr. Olivia Mackie McLean-Scocuzza-Internal Medicine

## 2021-10-16 NOTE — Patient Instructions (Addendum)
Panic Attack A panic attack is a sudden episode of severe anxiety, fear, or discomfort that causes physical and emotional symptoms. A panic attack may be in response to something frightening, or it may occur for no known reason. Symptoms of a panic attack can be similar to symptoms of a heart attack or stroke. It is important to see your health care provider when you have a panic attack so that these conditions can be ruled out. What are the causes? A panic attack may be caused by: An extreme, life-threatening situation, such as a war or natural disaster. An anxiety disorder, such as post-traumatic stress disorder. Depression. Panic disorder. Certain medical conditions, including heart problems, neurological conditions, and infections. Other causes may include: Certain over-the-counter and prescription medicines. Supplements that increase anxiety. Illegal drugs that increase heart rate and blood pressure, such as methamphetamine. What increases the risk? You are more likely to develop this condition if: You have another mental health condition. You use alcohol, illegal drugs, or other substances. You are under extreme stress. A life event is causing increased feelings of anxiety and depression. What are the signs or symptoms? A panic attack starts suddenly, usually lasts 5-10 minutes, and occurs with one or more of the following: A pounding heart, or a feeling that your heart is beating irregularly or faster than normal (palpitations). Sweating, trembling, or shaking. Shortness of breath, feeling smothered, or feeling choked. Chest pain or discomfort. Nausea or a strange feeling in your stomach. Dizziness, feeling light-headed, or feeling like you might faint. Other symptoms may include: Chills or hot flashes. Numbness or tingling in your lips, hands, or feet. Feeling confused, or feeling that you are not yourself. Fear of losing control or of being emotionally unstable, or fear of  dying. How is this diagnosed? A panic attack is diagnosed with an assessment by your health care provider. During the assessment, your health care provider will ask questions about: Your history of anxiety, depression, and panic attacks. Your medical history. Whether you drink alcohol, use drugs, take supplements, or take medicines. Be honest about your substance use. Your health care provider may also: Order blood tests or other kinds of tests to rule out serious medical conditions. Refer you to a mental health professional for further evaluation. How is this treated? A panic attack is a symptom of another condition. Treatment depends on the cause of the panic attack. If the cause is a medical problem, your health care provider will treat that problem or refer you to a specialist. If the cause is emotional, you may be given anti-anxiety medicines or referred to a counselor. Anti-anxiety medicines may reduce how often attacks happen, reduce how severe the attacks are, and lower anxiety. If the cause is a medicine, your health care provider may tell you to stop the medicine, change your dose, or take a different medicine. If the cause is an illegal drug, treatment may involve letting the drug wear off and taking medicine to help the drug leave your body or to stop its effects. Attacks caused by heavy drug use may continue even if you stop using the drug. Most panic attacks go away with treatment of the underlying problem. If you have panic attacks often, you may have a condition called panic disorder. Follow these instructions at home: Alcohol use Do not drink alcohol if: Your health care provider tells you not to drink. You are pregnant, may be pregnant, or are planning to become pregnant. If you drink alcohol: Limit how much   you have to: 0-1 drink a day for women. 0-2 drinks a day for men. Know how much alcohol is in your drink. In the [place] one drink equals one 12 oz bottle of beer (355  mL), one 5 oz glass of wine (148 mL), or one 1 oz glass of hard liquor (44 mL). General instructions Take over-the-counter and prescription medicines only as told by your health care provider. If you feel anxious, limit your caffeine intake. Take good care of your physical and mental health by: Eating a balanced diet that includes plenty of fresh fruits and vegetables, whole grains, lean meats, and low-fat dairy. Getting plenty of rest. Try to get 7-8 hours of uninterrupted sleep each night. Exercising regularly. Try to get 30 minutes of physical activity at least 5 days a week. Do not use any products that contain nicotine or tobacco. These products include cigarettes, chewing tobacco, and vaping devices, such as e-cigarettes. If you need help quitting, ask your health care provider. Keep all follow-up visits. This is important. Panic attacks may have underlying physical or emotional problems that take time to accurately diagnose. Where to find more information Substance Abuse [facility] Mental [facility]): [employer] [facility]): http://www.maynard.net/ Contact a health care provider if: Your symptoms do not improve, or they get worse. You are not able to take your medicine as prescribed because of side effects. Get help right away if: You have thoughts about hurting yourself or others. Get help right away if you feel like you may hurt yourself or others, or have thoughts about taking your own life. Go to your nearest emergency room or: Call 911. Call the National Suicide Prevention Lifeline at (716) 304-8612 or 988. This is open 24 hours a day. Text the Crisis Text Line at 325-627-2288. Summary A panic attack is a sudden episode of severe anxiety, fear, or discomfort that causes physical and emotional symptoms. Always see a health care provider to have the reasons for the panic attack correctly diagnosed. If your panic attack was caused by a  physical problem, follow your health care provider's suggestions for medicine, referral to a specialist, and lifestyle changes. If your panic attack was caused by an emotional problem, follow through with counseling from a qualified mental health specialist. If you feel like you may hurt yourself or others, call 911 and get help right away. This information is not intended to replace advice given to you by your health care provider. Make sure you discuss any questions you have with your health care provider. Document Revised: 06/06/2021 Document Reviewed: 06/06/2021 Elsevier Patient Education  2022 [employer]  Citalopram Tablets-take in the am  What is this medication? CITALOPRAM (sye TAL oh pram) treats depression. It increases the amount of serotonin in the brain, a hormone that helps regulate mood. It belongs to a group of medications called SSRIs. This medicine may be used for other purposes; ask your health care provider or pharmacist if you have questions. COMMON BRAND NAME(S): Celexa What should I tell my care team before I take this medication? They need to know if you have any of these conditions: Bipolar disorder or a family history of bipolar disorder Bleeding disorders Glaucoma Heart disease History of irregular heartbeat Kidney disease Liver disease Low levels of magnesium or potassium in the blood Receiving electroconvulsive therapy Seizures Suicidal thoughts, plans, or attempt; a previous suicide attempt by you or a family member Take medications that treat or prevent blood clots Thyroid disease An  unusual or allergic reaction to citalopram, escitalopram, other medications, foods, dyes, or preservatives Pregnant or trying to become pregnant Breast-feeding How should I use this medication? Take this medication by mouth with a glass of water. Follow the directions on the prescription label. You can take it with or without food. Take your medication at regular  intervals. Do not take your medication more often than directed. Do not stop taking this medication suddenly except upon the advice of your care team. Stopping this medication too quickly may cause serious side effects or your condition may worsen. A special MedGuide will be given to you by the pharmacist with each prescription and refill. Be sure to read this information carefully each time. Talk to your care team about the use of this medication in children. Special care may be needed. Patients over 29 years old may have a stronger reaction and need a smaller dose. Overdosage: If you think you have taken too much of this medicine contact a poison control center or emergency room at once. NOTE: This medicine is only for you. Do not share this medicine with others. What if I miss a dose? If you miss a dose, take it as soon as you can. If it is almost time for your next dose, take only that dose. Do not take double or extra doses. What may interact with this medication? Do not take this medication with any of the following: Certain medications for fungal infections like fluconazole, itraconazole, ketoconazole, posaconazole, voriconazole Cisapride Dronedarone Escitalopram Linezolid MAOIs like Carbex, Eldepryl, Marplan, Nardil, and Parnate Methylene blue (injected into a vein) Pimozide Thioridazine This medication may also interact with the following: Alcohol Amphetamines Aspirin and aspirin-like medications Carbamazepine Certain medications for depression, anxiety, or psychotic disturbances Certain medications for infections like chloroquine, clarithromycin, erythromycin, furazolidone, isoniazid, pentamidine Certain medications for migraine headaches like almotriptan, eletriptan, frovatriptan, naratriptan, rizatriptan, sumatriptan, zolmitriptan Certain medications for sleep Certain medications that treat or prevent blood clots like dalteparin, enoxaparin,  warfarin Cimetidine Diuretics Dofetilide Fentanyl Lithium Methadone Metoprolol NSAIDs, medications for pain and inflammation, like ibuprofen or naproxen Omeprazole Other medications that prolong the QT interval (cause an abnormal heart rhythm) Procarbazine Rasagiline Supplements like St. John's wort, kava kava, valerian Tramadol Tryptophan Ziprasidone This list may not describe all possible interactions. Give your health care provider a list of all the medicines, herbs, non-prescription drugs, or dietary supplements you use. Also tell them if you smoke, drink alcohol, or use illegal drugs. Some items may interact with your medicine. What should I watch for while using this medication? Tell your care team if your symptoms do not get better or if they get worse. Visit your care team for regular checks on your progress. Because it may take several weeks to see the full effects of this medication, it is important to continue your treatment as prescribed. Watch for new or worsening thoughts of suicide or depression. This includes sudden changes in mood, behavior, or thoughts. These changes can happen at any time but are more common in the beginning of treatment or after a change in dose. Call your care team right away if you experience these thoughts or worsening depression. Manic episodes may happen in patients with bipolar disorder who take this medication. Watch for changes in feelings or behaviors such as feeling anxious, nervous, agitated, panicky, irritable, hostile, aggressive, impulsive, severely restless, overly excited and hyperactive, or trouble sleeping. These symptoms can happen at anytime but are more common in the beginning of treatment or after a  change in dose. Call you care team right away if you notice any of these symptoms. You may get drowsy or dizzy. Do not drive, use machinery, or do anything that needs mental alertness until you know how this medication affects you. Do not  stand or sit up quickly, especially if you are an older patient. This reduces the risk of dizzy or fainting spells. Alcohol may interfere with the effect of this medication. Avoid alcoholic drinks. Your mouth may get dry. Chewing sugarless gum or sucking hard candy, and drinking plenty of water may help. Contact your care team if the problem does not go away or is severe. What side effects may I notice from receiving this medication? Side effects that you should report to your care team as soon as possible: Allergic reactions--skin rash, itching, hives, swelling of the face, lips, tongue, or throat Bleeding--bloody or black, tar-like stools, red or dark brown urine, vomiting blood or brown material that looks like coffee grounds, small, red or purple spots on skin, unusual bleeding or bruising Heart rhythm changes--fast or irregular heartbeat, dizziness, feeling faint or lightheaded, chest pain, trouble breathing Low sodium level--muscle weakness, fatigue, dizziness, headache, confusion Serotonin syndrome--irritability, confusion, fast or irregular heartbeat, muscle stiffness, twitching muscles, sweating, high fever, seizure, chills, vomiting, diarrhea Sudden eye pain or change in vision such as blurry vision, seeing halos around lights, vision loss Thoughts of suicide or self-harm, worsening mood, feelings of depression Side effects that usually do not require medical attention (report to your care team if they continue or are bothersome): Change in sex drive or performance Diarrhea Dry mouth Excessive sweating Nausea Tremors or shaking Upset stomach This list may not describe all possible side effects. Call your doctor for medical advice about side effects. You may report side effects to FDA at 1-800-FDA-1088. Where should I keep my medication? Keep out of reach of children and pets. Store at room temperature between 15 and 30 degrees C (59 and 86 degrees F). Throw away any unused medication  after the expiration date. NOTE: This sheet is a summary. It may not cover all possible information. If you have questions about this medicine, talk to your doctor, pharmacist, or health care provider.  2022 Elsevier/Gold Standard (2020-10-29 00:00:00)   Dr. Jari Sportsman insoles  Plantar Fasciitis Plantar fasciitis is a painful foot condition that affects the heel. It occurs when the band of tissue that connects the toes to the heel bone (plantar fascia) becomes irritated. This can happen as the result of exercising too much or doing other repetitive activities (overuse injury). Plantar fasciitis can cause mild irritation to severe pain that makes it difficult to walk or move. The pain is usually worse in the morning after sleeping, or after sitting or lying down for a period of time. Pain may also be worse after long periods of walking or standing. What are the causes? This condition may be caused by: Standing for long periods of time. Wearing shoes that do not have good arch support. Doing activities that put stress on joints (high-impact activities). This includes ballet and exercise that makes your heart beat faster (aerobic exercise), such as running. Being overweight. An abnormal way of walking (gait). Tight muscles in the back of your lower leg (calf). High arches in your feet or flat feet. Starting a new athletic activity. What are the signs or symptoms? The main symptom of this condition is heel pain. Pain may get worse after the following: Taking the first steps after a time  of rest, especially in the morning after awakening, or after you have been sitting or lying down for a while. Long periods of standing still. Pain may decrease after 30-45 minutes of activity, such as gentle walking. How is this diagnosed? This condition may be diagnosed based on your medical history, a physical exam, and your symptoms. Your health care provider will check for: A tender area on the bottom of your  foot. A high arch in your foot or flat feet. Pain when you move your foot. Difficulty moving your foot. You may have imaging tests to confirm the diagnosis, such as: X-rays. Ultrasound. MRI. How is this treated? Treatment for plantar fasciitis depends on how severe your condition is. Treatment may include: Rest, ice, pressure (compression), and raising (elevating) the affected foot. This is called RICE therapy. Your health care provider may recommend RICE therapy along with over-the-counter pain medicines to manage your pain. Exercises to stretch your calves and your plantar fascia. A splint that holds your foot in a stretched, upward position while you sleep (night splint). Physical therapy to relieve symptoms and prevent problems in the future. Injections of steroid medicine (cortisone) to relieve pain and inflammation. Stimulating your plantar fascia with electrical impulses (extracorporeal shock wave therapy). This is usually the last treatment option before surgery. Surgery, if other treatments have not worked after 12 months. Follow these instructions at home: Managing pain, stiffness, and swelling  If directed, put ice on the painful area. To do this: Put ice in a plastic bag, or use a frozen bottle of water. Place a towel between your skin and the bag or bottle. Roll the bottom of your foot over the bag or bottle. Do this for 20 minutes, 2-3 times a day. Wear athletic shoes that have air-sole or gel-sole cushions, or try soft shoe inserts that are designed for plantar fasciitis. Elevate your foot above the level of your heart while you are sitting or lying down. Activity Avoid activities that cause pain. Ask your health care provider what activities are safe for you. Do physical therapy exercises and stretches as told by your health care provider. Try activities and forms of exercise that are easier on your joints (low impact). Examples include swimming, water aerobics, and  biking. General instructions Take over-the-counter and prescription medicines only as told by your health care provider. Wear a night splint while sleeping, if told by your health care provider. Loosen the splint if your toes tingle, become numb, or turn cold and blue. Maintain a healthy weight, or work with your health care provider to lose weight as needed. Keep all follow-up visits. This is important. Contact a health care provider if you have: Symptoms that do not go away with home treatment. Pain that gets worse. Pain that affects your ability to move or do daily activities. Summary Plantar fasciitis is a painful foot condition that affects the heel. It occurs when the band of tissue that connects the toes to the heel bone (plantar fascia) becomes irritated. Heel pain is the main symptom of this condition. It may get worse after exercising too much or standing still for a long time. Treatment varies, but it usually starts with rest, ice, pressure (compression), and raising (elevating) the affected foot. This is called RICE therapy. Over-the-counter medicines can also be used to manage pain. This information is not intended to replace advice given to you by your health care provider. Make sure you discuss any questions you have with your health care provider. Document  Revised: 02/13/2020 Document Reviewed: 02/13/2020 Elsevier Patient Education  2022 Elsevier Inc.  Plantar Fasciitis Rehab Ask your health care provider which exercises are safe for you. Do exercises exactly as told by your health care provider and adjust them as directed. It is normal to feel mild stretching, pulling, tightness, or discomfort as you do these exercises. Stop right away if you feel sudden pain or your pain gets worse. Do not begin these exercises until told by your health care provider. Stretching and range-of-motion exercises These exercises warm up your muscles and joints and improve the movement and  flexibility of your foot. These exercises also help to relieve pain. Plantar fascia stretch  Sit with your left / right leg crossed over your opposite knee. Hold your heel with one hand with that thumb near your arch. With your other hand, hold your toes and gently pull them back toward the top of your foot. You should feel a stretch on the base (bottom) of your toes, or the bottom of your foot (plantar fascia), or both. Hold this stretch for__________ seconds. Slowly release your toes and return to the starting position. Repeat __________ times. Complete this exercise __________ times a day. Gastrocnemius stretch, standing This exercise is also called a calf (gastroc) stretch. It stretches the muscles in the back of the upper calf. Stand with your hands against a wall. Extend your left / right leg behind you, and bend your front knee slightly. Keeping your heels on the floor, your toes facing forward, and your back knee straight, shift your weight toward the wall. Do not arch your back. You should feel a gentle stretch in your upper calf. Hold this position for __________ seconds. Repeat __________ times. Complete this exercise __________ times a day. Soleus stretch, standing This exercise is also called a calf (soleus) stretch. It stretches the muscles in the back of the lower calf. Stand with your hands against a wall. Extend your left / right leg behind you, and bend your front knee slightly. Keeping your heels on the floor and your toes facing forward, bend your back knee and shift your weight slightly over your back leg. You should feel a gentle stretch deep in your lower calf. Hold this position for __________ seconds. Repeat __________ times. Complete this exercise __________ times a day. Gastroc and soleus stretch, standing step This exercise stretches the muscles in the back of the lower leg. These muscles are in the upper calf (gastrocnemius) and the lower calf (soleus). Stand with  the ball of your left / right foot on the front of a step. The ball of your foot is on the walking surface, right under your toes. Keep your other foot firmly on the same step. Hold on to the wall or a railing for balance. Slowly lift your other foot, allowing your body weight to press your heel down over the edge of the front of the step. Keep knee straight and unbent. You should feel a stretch in your calf. Hold this position for __________ seconds. Return both feet to the step. Repeat this exercise with a slight bend in your left / right knee. Repeat __________ times with your left / right knee straight and __________ times with your left / right knee bent. Complete this exercise __________ times a day. Balance exercise This exercise builds your balance and strength control of your arch to help take pressure off your plantar fascia. Single leg stand If this exercise is too easy, you can try it with  your eyes closed or while standing on a pillow. Without shoes, stand near a railing or in a doorway. You may hold on to the railing or door frame as needed. Stand on your left / right foot. Keep your big toe down on the floor and lift the arch of your foot. You should feel a stretch across the bottom of your foot and your arch. Do not let your foot roll inward. Hold this position for __________ seconds. Repeat __________ times. Complete this exercise __________ times a day. This information is not intended to replace advice given to you by your health care provider. Make sure you discuss any questions you have with your health care provider. Document Revised: 08/09/2020 Document Reviewed: 08/09/2020 Elsevier Patient Education  2022 [employer].

## 2021-10-18 ENCOUNTER — Encounter: Payer: Self-pay | Admitting: Internal Medicine

## 2021-10-18 NOTE — Addendum Note (Signed)
Addended by: Quentin Ore on: 10/18/2021 04:54 PM   Modules accepted: Orders

## 2021-10-18 NOTE — Telephone Encounter (Signed)
Labs printed and placed on your desk to review.

## 2021-10-24 NOTE — Addendum Note (Signed)
Addended by: Quentin Ore on: 10/24/2021 09:56 AM   Modules accepted: Orders

## 2021-10-28 ENCOUNTER — Ambulatory Visit: Payer: BC Managed Care – PPO | Admitting: Cardiology

## 2021-10-28 ENCOUNTER — Encounter: Payer: Self-pay | Admitting: Cardiology

## 2021-10-28 ENCOUNTER — Other Ambulatory Visit: Payer: Self-pay

## 2021-10-28 VITALS — BP 140/82 | HR 79 | Ht 63.0 in | Wt 202.0 lb

## 2021-10-28 DIAGNOSIS — R072 Precordial pain: Secondary | ICD-10-CM | POA: Diagnosis not present

## 2021-10-28 DIAGNOSIS — F419 Anxiety disorder, unspecified: Secondary | ICD-10-CM | POA: Diagnosis not present

## 2021-10-28 DIAGNOSIS — R030 Elevated blood-pressure reading, without diagnosis of hypertension: Secondary | ICD-10-CM | POA: Diagnosis not present

## 2021-10-28 NOTE — Progress Notes (Signed)
Cardiology Office Note:    Date:  10/28/2021   ID:  Anita Jacobs, DOB 01/21/1986, MRN [id number]  PCP:  McLean-Scocuzza, Nino Glow, MD   [facility] Providers Cardiologist:  None     Referring MD: McLean-Scocuzza, Olivia Mackie *   Chief Complaint  Patient presents with   New Patient (Initial Visit)    Referred by PCP for CRP elevated. Paitent c.o chest pain and leg cramps. Meds reviewed verbally with patient.    Anita Jacobs is a 35 y.o. female who is being seen today for the evaluation of chest pain at the request of McLean-Scocuzza, Olivia Mackie *.   History of Present Illness:    Anita Jacobs is a 35 y.o. female with a hx of anxiety who presents with chest pain.  States having symptoms of chest discomfort, worsening over the past several weeks.  Symptoms usually occur when patient is anxious.  She tries to exercise by walking at least 1 mile 3 times a week.  She denies any chest pain or shortness of breath when she exerts herself or walks.  Denies any history of heart disease, denies smoking.  Past Medical History:  Diagnosis Date   Anxiety    BRCA negative    family study at 46- Dr. Pearlie Oyster pos brca 2 gene in family   Chicken pox    Chronic back pain    Chronic right-sided low back pain with right-sided sciatica 10/16/2021   Depression    Family history of BRCA2 gene positive    mother   Hair loss    Hematuria    Kidney stone    macrobid daily   Obesity (BMI 35.0-39.9 without comorbidity)    Ureteral colic     Past Surgical History:  Procedure Laterality Date   TONSILLECTOMY     2007   WISDOM TOOTH EXTRACTION      Current Medications: Current Meds  Medication Sig   citalopram (CELEXA) 10 MG tablet Take 1 tablet (10 mg total) by mouth daily.   hydrOXYzine (ATARAX) 25 MG tablet Take 1 tablet (25 mg total) by mouth daily as needed.     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 [employer]   Tobacco Use   Smoking status: Never   Smokeless tobacco: Never  Vaping Use   Vaping Use: Never used  Substance and Sexual Activity   Alcohol use: No   Drug use: No   Sexual activity: Yes    Birth control/protection: None  Other Topics Concern   Not on file  Social History Narrative   Married    Played golf for years    3 kids husband Engineer, structural    2 kids identical twin boys 2016 born and 27 son 53 y.o    -as of 10/2021 10 y.o and 2 twin 33 y.o boys       Undergraduate degree    Owns guns    Safe in relationship    Wearing seat belt    10/2021 Richardson Chiquito    Social Determinants of Health   Financial Resource Strain: Not on file  Food Insecurity: Not on file  Transportation Needs:  Not on file  Physical Activity: Not on file  Stress: Not on file  Social Connections: Not on file     Family History: The patient's family history includes Alcohol abuse in her paternal grandfather; Breast cancer in her cousin and maternal aunt; Breast cancer (age of onset: 98) in her maternal grandmother; COPD in her mother; Cancer in her mother; Diabetes in her maternal grandfather and paternal grandmother; Early death in her maternal grandmother and mother; Hyperlipidemia in her father; Hypertension in her brother and father; Hypothyroidism in her maternal aunt; Ovarian cancer (age of onset: 82) in her maternal grandmother; Ovarian cancer (age of onset: 42) in her maternal aunt; Pancreatic cancer in her mother; Skin cancer in her maternal aunt.  ROS:   Please see the history of present illness.     All other systems reviewed and are negative.  EKGs/Labs/Other Studies Reviewed:    The following studies were reviewed today:   EKG:  EKG is  ordered today.   The ekg ordered today demonstrates normal sinus rhythm, normal ECG  Recent Labs: No results found for requested labs within last 8760 hours.  Recent Lipid Panel    Component Value Date/Time   CHOL 120 04/08/2018 0910   TRIG 62.0 04/08/2018 0910   HDL 49.60 04/08/2018 0910   CHOLHDL 2 04/08/2018 0910   VLDL 12.4 04/08/2018 0910   LDLCALC 58 04/08/2018 0910     Risk Assessment/Calculations:          Physical Exam:    VS:  BP 140/82 (BP Location: Left Arm, Patient Position: Sitting, Cuff Size: Large)    Pulse 79    Ht '5\' 3"'  (1.6 m)    Wt 202 lb (91.6 kg)    SpO2 99%    BMI 35.78 kg/m     Wt Readings from Last 3 Encounters:  10/28/21 202 lb (91.6 kg)  10/16/21 205 lb 6.4 oz (93.2 kg)  11/29/20 204 lb (92.5 kg)     GEN:  Well nourished, well developed in no acute distress HEENT: Normal NECK: No JVD; No carotid bruits LYMPHATICS: No lymphadenopathy CARDIAC: RRR, no murmurs, rubs, gallops RESPIRATORY:  Clear to auscultation without rales, wheezing or rhonchi  ABDOMEN: Soft, non-tender, non-distended MUSCULOSKELETAL:  No edema; No deformity  SKIN: Warm and dry NEUROLOGIC:  Alert and oriented x 3 PSYCHIATRIC:  Normal affect   ASSESSMENT:    1. Precordial pain   2. Anxiety   3. Elevated BP without diagnosis of hypertension    PLAN:    In order of problems listed above:  Chest pain, associated with anxiety.  Denies any symptoms with exertion.  Patient is low risk for cardiac disease, she has no cardiac risk factors.  Symptoms occur only with anxiety, as such, management of anxiety will help with patient symptoms.  No indication for cardiac testing at this time, patient educated on cardiac etiology for chest pain and symptoms.  If these were to occur, additional cardiac testing will be warranted. History of anxiety, medications, management as per PCP. Elevated BP without hypertension.  Previous BP checks were normal.  Close monitoring advised, no indication for medication  at this time.  Follow-up as needed.      Medication Adjustments/Labs and Tests Ordered: Current medicines are reviewed at length with the patient today.  Concerns regarding medicines are outlined above.  Orders Placed This Encounter  Procedures   EKG 12-Lead   No orders of the defined types were placed in this encounter.   Patient  Instructions  Medication Instructions:  Your physician recommends that you continue on your current medications as directed. Please refer to the Current Medication list given to you today.  *If you need a refill on your cardiac medications before your next appointment, please call your pharmacy*   Lab Work: None ordered If you have labs (blood work) drawn today and your tests are completely normal, you will receive your results only by: [place] (if you have MyChart) OR A paper copy in the mail If you have any lab test that is abnormal or we need to change your treatment, we will call you to review the results.   Testing/Procedures: None ordered   Follow-Up: At [facility], you and your health needs are our priority.  As part of our continuing mission to provide you with exceptional heart care, we have created designated Provider Care Teams.  These Care Teams include your primary Cardiologist (physician) and Advanced Practice Providers (APPs -  Physician Assistants and Nurse Practitioners) who all work together to provide you with the care you need, when you need it.  We recommend signing up for the patient portal called "MyChart".  Sign up information is provided on this After Visit Summary.  MyChart is used to connect with patients for Virtual Visits (Telemedicine).  Patients are able to view lab/test results, encounter notes, upcoming appointments, etc.  Non-urgent messages can be sent to your provider as well.   To learn more about what you can do with MyChart, go to [email or web address].    Your next appointment:   Follow up as  needed   The format for your next appointment:   In Person  Provider:   You may see None or one of the following Advanced Practice Providers on your designated Care Team:   Murray Hodgkins, NP Christell Faith, PA-C Cadence Kathlen Mody, [place]    Other Instructions      Signed, Kate Sable, MD  10/28/2021 10:41 AM    [place]

## 2021-10-28 NOTE — Patient Instructions (Signed)
Medication Instructions:  Your physician recommends that you continue on your current medications as directed. Please refer to the Current Medication list given to you today.  *If you need a refill on your cardiac medications before your next appointment, please call your pharmacy*   Lab Work: None ordered If you have labs (blood work) drawn today and your tests are completely normal, you will receive your results only by: MyChart Message (if you have MyChart) OR A paper copy in the mail If you have any lab test that is abnormal or we need to change your treatment, we will call you to review the results.   Testing/Procedures: None ordered   Follow-Up: At [facility], you and your health needs are our priority.  As part of our continuing mission to provide you with exceptional heart care, we have created designated Provider Care Teams.  These Care Teams include your primary Cardiologist (physician) and Advanced Practice Providers (APPs -  Physician Assistants and Nurse Practitioners) who all work together to provide you with the care you need, when you need it.  We recommend signing up for the patient portal called "MyChart".  Sign up information is provided on this After Visit Summary.  MyChart is used to connect with patients for Virtual Visits (Telemedicine).  Patients are able to view lab/test results, encounter notes, upcoming appointments, etc.  Non-urgent messages can be sent to your provider as well.   To learn more about what you can do with MyChart, go to [email or web address].    Your next appointment:   Follow up as needed   The format for your next appointment:   In Person  Provider:   You may see None or one of the following Advanced Practice Providers on your designated Care Team:   Christopher Berge, NP Ryan Dunn, PA-C Cadence Furth, PA-C    Other Instructions   

## 2021-11-08 ENCOUNTER — Ambulatory Visit: Payer: BC Managed Care – PPO | Admitting: Cardiology

## 2021-11-27 ENCOUNTER — Encounter: Payer: Self-pay | Admitting: Internal Medicine

## 2021-11-27 ENCOUNTER — Other Ambulatory Visit: Payer: Self-pay

## 2021-11-27 ENCOUNTER — Ambulatory Visit: Payer: BC Managed Care – PPO | Admitting: Internal Medicine

## 2021-11-27 VITALS — BP 120/80 | HR 68 | Temp 97.6°F | Ht 63.0 in | Wt 196.6 lb

## 2021-11-27 DIAGNOSIS — Z0000 Encounter for general adult medical examination without abnormal findings: Secondary | ICD-10-CM | POA: Diagnosis not present

## 2021-11-27 DIAGNOSIS — F410 Panic disorder [episodic paroxysmal anxiety] without agoraphobia: Secondary | ICD-10-CM | POA: Diagnosis not present

## 2021-11-27 DIAGNOSIS — F339 Major depressive disorder, recurrent, unspecified: Secondary | ICD-10-CM

## 2021-11-27 DIAGNOSIS — R109 Unspecified abdominal pain: Secondary | ICD-10-CM

## 2021-11-27 DIAGNOSIS — F419 Anxiety disorder, unspecified: Secondary | ICD-10-CM

## 2021-11-27 DIAGNOSIS — R7982 Elevated C-reactive protein (CRP): Secondary | ICD-10-CM

## 2021-11-27 DIAGNOSIS — R770 Abnormality of albumin: Secondary | ICD-10-CM | POA: Diagnosis not present

## 2021-11-27 DIAGNOSIS — R140 Abdominal distension (gaseous): Secondary | ICD-10-CM

## 2021-11-27 DIAGNOSIS — Z8379 Family history of other diseases of the digestive system: Secondary | ICD-10-CM

## 2021-11-27 DIAGNOSIS — K921 Melena: Secondary | ICD-10-CM

## 2021-11-27 DIAGNOSIS — R194 Change in bowel habit: Secondary | ICD-10-CM

## 2021-11-27 MED ORDER — HYDROXYZINE HCL 25 MG PO TABS: 12.5000 mg | ORAL_TABLET | Freq: Every day | ORAL | 3 refills | Status: DC | PRN

## 2021-11-27 MED ORDER — CITALOPRAM HYDROBROMIDE 20 MG PO TABS: 20.0000 mg | ORAL_TABLET | Freq: Every day | ORAL | 3 refills | Status: DC

## 2021-11-27 NOTE — Progress Notes (Addendum)
Chief Complaint  Patient presents with   Annual Exam   Annual  Anxiety phq 9 score 5 and gad 7 score 5 improved taking atarax less freq though tried and on celexa 10 mg feels like needs more   BP trending 120s-130s/mid 80s or less will continue to monitor disc norvasc 2.5 if elevated   Labs elevated CRP HS and albumin will recheck cards [place] not worried elevated CRP  Review of Systems  Constitutional:  Negative for weight loss.  HENT:  Negative for hearing loss.   Eyes:  Negative for blurred vision.  Respiratory:  Negative for shortness of breath.   Cardiovascular:  Negative for chest pain.  Gastrointestinal:  Negative for abdominal pain and blood in stool.  Genitourinary:  Negative for dysuria.  Musculoskeletal:  Negative for falls and joint pain.  Skin:  Negative for rash.  Neurological:  Negative for headaches.  Psychiatric/Behavioral:  Negative for depression. The patient is nervous/anxious.   Past Medical History:  Diagnosis Date   Anxiety    BRCA negative    family study at 89- Dr. Pearlie Oyster pos brca 2 gene in family   Chicken pox    Chronic back pain    Chronic right-sided low back pain with right-sided sciatica 10/16/2021   Depression    Family history of BRCA2 gene positive    mother   Hair loss    Hematuria    Kidney stone    macrobid daily   Obesity (BMI 35.0-39.9 without comorbidity)    Ureteral colic    Past Surgical History:  Procedure Laterality Date   TONSILLECTOMY     2007   WISDOM TOOTH EXTRACTION     Family History  Problem Relation Age of Onset   Cancer Mother        pancreatic ca dx 78 BRCA +   Pancreatic cancer Mother    COPD Mother        pancreatitc cancer BRCA2 +   Early death Mother    Hypertension Father    Hyperlipidemia Father    Crohn's disease Father        dxed ? had in early 45 dx at 10   Hypertension Brother    Ovarian cancer Maternal Grandmother 25   Breast cancer Maternal Grandmother 86   Early death  Maternal Grandmother    Diabetes Maternal Grandfather    Diabetes Paternal Grandmother    Alcohol abuse Paternal Grandfather    Ovarian cancer Maternal Aunt 65   Breast cancer Maternal Aunt        Great Aunts   Skin cancer Maternal Aunt    Hypothyroidism Maternal Aunt    Breast cancer Cousin    Social History   Socioeconomic History   Marital status: Married    Spouse name: Not on file   Number of children: Not on file   Years of education: Not on file   Highest education level: Not on file  Occupational History   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 [employer]   Tobacco Use   Smoking status: Never   Smokeless tobacco: Never  Vaping Use   Vaping Use: Never used  Substance and Sexual Activity   Alcohol use: No   Drug use: No   Sexual activity: Yes    Birth control/protection: None  Other Topics Concern   Not on file  Social History Narrative   Married    Played golf for years    3 kids husband  police officer    2 kids identical twin boys 2016 born and 1 son 18 y.o    -as of 10/2021 12 y.o and 2 twin 34 y.o boys       Undergraduate degree    Owns guns    Safe in relationship    Wearing seat belt    10/2021 Richardson Chiquito    Social Determinants of Health   Financial Resource Strain: Not on file  Food Insecurity: Not on file  Transportation Needs: Not on file  Physical Activity: Not on file  Stress: Not on file  Social Connections: Not on file  Intimate Partner Violence: Not on file   Current Meds  Medication Sig   [DISCONTINUED] citalopram (CELEXA) 10 MG tablet Take 1 tablet (10 mg total) by mouth daily.   Allergies  Allergen Reactions   Codeine Nausea Only   No results found for this or any previous visit (from the past 2160 hour(s)). Objective  Body mass index is 34.83 kg/m. Wt Readings from Last 3 Encounters:  11/27/21 196 lb 9.6 oz (89.2 kg)  10/28/21 202 lb (91.6 kg)  10/16/21 205 lb 6.4 oz (93.2 kg)   Temp Readings from Last 3  Encounters:  11/27/21 97.6 F (36.4 C) (Temporal)  10/16/21 (!) 97.4 F (36.3 C) (Temporal)  03/23/18 98.1 F (36.7 C) (Oral)   BP Readings from Last 3 Encounters:  11/27/21 120/80  10/28/21 140/82  10/16/21 122/84   Pulse Readings from Last 3 Encounters:  11/27/21 68  10/28/21 79  10/16/21 83    Physical Exam Vitals and nursing note reviewed.  Constitutional:      Appearance: Normal appearance. She is well-developed and well-groomed.  HENT:     Head: Normocephalic and atraumatic.  Eyes:     Conjunctiva/sclera: Conjunctivae normal.     Pupils: Pupils are equal, round, and reactive to light.  Cardiovascular:     Rate and Rhythm: Normal rate and regular rhythm.     Heart sounds: Normal heart sounds. No murmur heard. Pulmonary:     Effort: Pulmonary effort is normal.     Breath sounds: Normal breath sounds.  Abdominal:     General: Abdomen is flat. Bowel sounds are normal.     Tenderness: There is no abdominal tenderness.  Musculoskeletal:        General: No tenderness.  Skin:    General: Skin is warm and dry.  Neurological:     General: No focal deficit present.     Mental Status: She is alert and oriented to person, place, and time. Mental status is at baseline.     Cranial Nerves: Cranial nerves 2-12 are intact.     Gait: Gait is intact.  Psychiatric:        Attention and Perception: Attention and perception normal.        Mood and Affect: Mood and affect normal.        Speech: Speech normal.        Behavior: Behavior normal. Behavior is cooperative.        Thought Content: Thought content normal.        Cognition and Memory: Cognition and memory normal.        Judgment: Judgment normal.    Assessment  Plan  Annual physical exam See below  Anxiety - Plan: hydrOXYzine (ATARAX) 12.5-25 MG tablet, citalopram (CELEXA) 20 MG tablet increased from 10 mg  Panic attack  Depression, recurrent ([place])  Declines therapy for now meds working  Elevated C-reactive  protein (CRP) - Plan: CRP High sensitivity Cards leb not worried about this  High serum albumin - Plan: Albumin    BP continue to monitor  Consider norvasc 2.5 if elevated   HM- Basic labs had at work 10/17/21 cmet, cbc, lipid, tsh, t4, b12, ua and autoimmune labs neg   Flu shot declines 10/16/21  tdap due 2026  [place] 2/2 declines 2/2  Check hep B status declines MMR    Pap [facility] 2017/2018 get records. Had 11/29/20 neg pap negHPV testing done [facility]  Mammogram 42  Colonoscopy 52  Skin denies for now  [facility]  Dentist Dr. Carman Ching   Rec healthy diet and exercise   As of 04/2022 my chart message  Good morning! I have noticed a change in my bowel habits over the past couple of months. I've always had some issues with constipation but it seems like that is changing. I was bleeding about a month ago but I think it was hemorrhoids. Is this something I should see you for or someone else? With my dads history of ulcerative colitis and now Crohn's disease I figured I should probably get it checked out. I'm getting cramps and bloating more frequently, the consistency has changed to thinner, and I'm straining a lot. If I need to schedule with you just let me know and I will call the front desk to schedule an appointment. Thank you!       Provider: Dr. Olivia Mackie McLean-Scocuzza-Internal Medicine

## 2021-11-27 NOTE — Patient Instructions (Addendum)
Consider norvasc 2.5 mg daily for blood pressure  1 month my chart about anxiety  Amlodipine Tablets What is this medication? AMLODIPINE (am LOE di peen) treats high blood pressure and prevents chest pain (angina). It works by relaxing the blood vessels, which helps decrease the amount of work your heart has to do. It belongs to a group of medications called calcium channel blockers. This medicine may be used for other purposes; ask your health care provider or pharmacist if you have questions. COMMON BRAND NAME(S): Norvasc What should I tell my care team before I take this medication? They need to know if you have any of these conditions: Heart disease Liver disease An unusual or allergic reaction to amlodipine, other medications, foods, dyes, or preservatives Pregnant or trying to get pregnant Breast-feeding How should I use this medication? Take this medication by mouth. Take it as directed on the prescription label at the same time every day. You can take it with or without food. If it upsets your stomach, take it with food. Keep taking it unless your care team tells you to stop. Talk to your care team about the use of this medication in children. While it may be prescribed for children as young as 6 for selected conditions, precautions do apply. Overdosage: If you think you have taken too much of this medicine contact a poison control center or emergency room at once. NOTE: This medicine is only for you. Do not share this medicine with others. What if I miss a dose? If you miss a dose, take it as soon as you can. If it is almost time for your next dose, take only that dose. Do not take double or extra doses. What may interact with this medication? Clarithromycin Cyclosporine Diltiazem Itraconazole Simvastatin Tacrolimus This list may not describe all possible interactions. Give your health care provider a list of all the medicines, herbs, non-prescription drugs, or dietary  supplements you use. Also tell them if you smoke, drink alcohol, or use illegal drugs. Some items may interact with your medicine. What should I watch for while using this medication? Visit your health care provider for regular checks on your progress. Check your blood pressure as directed. Ask your health care provider what your blood pressure should be. Also, find out when you should contact him or her. Do not treat yourself for coughs, colds, or pain while you are using this medication without asking your health care provider for advice. Some medications may increase your blood pressure. You may get drowsy or dizzy. Do not drive, use machinery, or do anything that needs mental alertness until you know how this medication affects you. Do not stand up or sit up quickly, especially if you are an older patient. This reduces the risk of dizzy or fainting spells. Alcohol can make you more drowsy and dizzy. Avoid alcoholic drinks. What side effects may I notice from receiving this medication? Side effects that you should report to your care team as soon as possible: Allergic reactions--skin rash, itching, hives, swelling of the face, lips, tongue, or throat Heart attack--pain or tightness in the chest, shoulders, arms, or jaw, nausea, shortness of breath, cold or clammy skin, feeling faint or lightheaded Low blood pressure--dizziness, feeling faint or lightheaded, blurry vision Side effects that usually do not require medical attention (report these to your care team if they continue or are bothersome): Facial flushing, redness Heart palpitations--rapid, pounding, or irregular heartbeat Nausea Stomach pain Swelling of the ankles, hands, or feet This  list may not describe all possible side effects. Call your doctor for medical advice about side effects. You may report side effects to FDA at 1-800-FDA-1088. Where should I keep my medication? Keep out of the reach of children and pets. Store at room  temperature between 20 and 25 degrees C (68 and 77 degrees F). Protect from light and moisture. Keep the container tightly closed. Get rid of any unused medication after the expiration date. To get rid of medications that are no longer needed or have expired: Take the medication to a medication take-back program. Check with your pharmacy or law enforcement to find a location. If you cannot return the medication, check the label or package insert to see if the medication should be thrown out in the garbage or flushed down the toilet. If you are not sure, ask your health care provider. If it is safe to put in the trash, empty the medication out of the container. Mix the medication with cat litter, dirt, coffee grounds, or other unwanted substance. Seal the mixture in a bag or container. Put it in the trash. NOTE: This sheet is a summary. It may not cover all possible information. If you have questions about this medicine, talk to your doctor, pharmacist, or health care provider.  2022 Elsevier/Gold Standard (2021-07-16 00:00:00)

## 2021-11-28 LAB — HIGH SENSITIVITY CRP: CRP, High Sensitivity: 3.21 mg/L — ABNORMAL HIGH (ref 0.00–3.00)

## 2021-11-28 LAB — ALBUMIN: Albumin: 4.4 g/dL (ref 3.8–4.8)

## 2021-12-17 ENCOUNTER — Encounter: Payer: Self-pay | Admitting: Internal Medicine

## 2021-12-17 ENCOUNTER — Telehealth: Payer: Self-pay | Admitting: Cardiology

## 2021-12-17 NOTE — Telephone Encounter (Signed)
Pt c/o of Chest Pain: STAT if CP now or developed within 24 hours  1. Are you having CP right now? No cp now   2. Are you experiencing any other symptoms (ex. SOB, nausea, vomiting, sweating)? SOB at rest chest pressure heaviness slow hard palpitation  3. How long have you been experiencing CP? Had covid 2 weeks ago started after got better  4. Is your CP continuous or coming and going? Comes and goes a few times a day   5. Have you taken Nitroglycerin? No  ?  Scheduled [facility]-[address] [place] - [address]

## 2021-12-17 NOTE — Telephone Encounter (Signed)
Called patient and left a detailed VM per DPR on file. I informed the patient that after close review of her history, I think that the appointment on Thursday is appropriate. I encouraged her to call back with any questions or concerns.

## 2021-12-17 NOTE — Telephone Encounter (Signed)
Does Patient need an in person appointment?

## 2021-12-19 ENCOUNTER — Other Ambulatory Visit: Payer: Self-pay

## 2021-12-19 ENCOUNTER — Ambulatory Visit: Payer: BC Managed Care – PPO | Admitting: Cardiology

## 2021-12-19 ENCOUNTER — Ambulatory Visit (INDEPENDENT_AMBULATORY_CARE_PROVIDER_SITE_OTHER): Payer: BC Managed Care – PPO

## 2021-12-19 ENCOUNTER — Encounter: Payer: Self-pay | Admitting: Cardiology

## 2021-12-19 VITALS — BP 130/76 | HR 69 | Ht 63.0 in | Wt 195.0 lb

## 2021-12-19 DIAGNOSIS — R002 Palpitations: Secondary | ICD-10-CM

## 2021-12-19 DIAGNOSIS — R0602 Shortness of breath: Secondary | ICD-10-CM | POA: Diagnosis not present

## 2021-12-19 DIAGNOSIS — F419 Anxiety disorder, unspecified: Secondary | ICD-10-CM

## 2021-12-19 NOTE — Patient Instructions (Signed)
Medication Instructions:   Your physician recommends that you continue on your current medications as directed. Please refer to the Current Medication list given to you today.  *If you need a refill on your cardiac medications before your next appointment, please call your pharmacy*   Lab Work: None ordered If you have labs (blood work) drawn today and your tests are completely normal, you will receive your results only by: [facility] (if you have MyChart) OR A paper copy in the mail If you have any lab test that is abnormal or we need to change your treatment, we will call you to review the results.   Testing/Procedures:   Your physician has requested that you have an echocardiogram. Echocardiography is a painless test that uses sound waves to create images of your heart. It provides your doctor with information about the size and shape of your heart and how well your hearts chambers and valves are working. This procedure takes approximately one hour. There are no restrictions for this procedure.  2.   Your physician has recommended that you wear a Zio XT monitor for 2 weeks. This will be mailed to your home address in 4-5 business days.  This monitor is a medical device that records the hearts electrical activity. Doctors most often use these monitors to diagnose arrhythmias. Arrhythmias are problems with the speed or rhythm of the heartbeat. The monitor is a small device applied to your chest. You can wear one while you do your normal daily activities. While wearing this monitor if you have any symptoms to push the button and record what you felt. Once you have worn this monitor for the period of time provider prescribed (Usually 14 days), you will return the monitor device in the postage paid box. Once it is returned they will download the data collected and provide [place] with a report which the provider will then review and we will call you with those results. Important tips:  Avoid  showering during the first 24 hours of wearing the monitor. Avoid excessive sweating to help maximize wear time. Do not submerge the device, no hot tubs, and no swimming pools. Keep any lotions or oils away from the patch. After 24 hours you may shower with the patch on. Take brief showers with your back facing the shower head.  Do not remove patch once it has been placed because that will interrupt data and decrease adhesive wear time. Push the button when you have any symptoms and write down what you were feeling. Once you have completed wearing your monitor, remove and place into box which has postage paid and place in your outgoing mailbox.  If for some reason you have misplaced your box then call our office and we can provide another box and/or mail it off for you.       Follow-Up: At [facility], you and your health needs are our priority.  As part of our continuing mission to provide you with exceptional heart care, we have created designated Provider Care Teams.  These Care Teams include your primary Cardiologist (physician) and Advanced Practice Providers (APPs -  Physician Assistants and Nurse Practitioners) who all work together to provide you with the care you need, when you need it.  We recommend signing up for the patient portal called "MyChart".  Sign up information is provided on this After Visit Summary.  MyChart is used to connect with patients for Virtual Visits (Telemedicine).  Patients are able to view lab/test results, encounter  notes, upcoming appointments, etc.  Non-urgent messages can be sent to your provider as well.   To learn more about what you can do with MyChart, go to [email or web address].    Your next appointment:   6 week(s)  The format for your next appointment:   In Person  Provider:   You may see Kate Sable, MD or one of the following Advanced Practice Providers on your designated Care Team:   Murray Hodgkins, NP Christell Faith,  PA-C Cadence Kathlen Mody, [place]    Other Instructions

## 2021-12-19 NOTE — Progress Notes (Signed)
Cardiology Office Note:    Date:  12/19/2021   ID:  Anita Jacobs, DOB Mar 01, 1986, MRN [id number]  PCP:  McLean-Scocuzza, Nino Glow, MD   [facility] Providers Cardiologist:  None     Referring MD: McLean-Scocuzza, Olivia Mackie *   Chief Complaint  Patient presents with   Other    Patient c.o "butterflies in her chest", SOB, and palpitations. Meds reviewed verbally with patient.     History of Present Illness:    Anita Jacobs is a 36 y.o. female with a hx of anxiety who presents with palpitations.  Patient was diagnosed with COVID-19 about 3 weeks ago, since then, she has noticed palpitations.  Symptoms of palpitations occur almost daily, lasting a few seconds.  Symptoms are sometimes associated with shortness of breath.  Patient states having the feeling of butterflies in her chest.  She otherwise feels well, denies chest pain.  Past Medical History:  Diagnosis Date   Anxiety    BRCA negative    family study at 67- Dr. Pearlie Oyster pos brca 2 gene in family   Chicken pox    Chronic back pain    Chronic right-sided low back pain with right-sided sciatica 10/16/2021   COVID-19    late 11/2021   Depression    Family history of BRCA2 gene positive    mother   Hair loss    Hematuria    Kidney stone    macrobid daily   Obesity (BMI 35.0-39.9 without comorbidity)    Ureteral colic     Past Surgical History:  Procedure Laterality Date   TONSILLECTOMY     2007   WISDOM TOOTH EXTRACTION      Current Medications: Current Meds  Medication Sig   citalopram (CELEXA) 20 MG tablet Take 1 tablet (20 mg total) by mouth daily.   hydrOXYzine (ATARAX) 25 MG tablet Take 0.5-1 tablets (12.5-25 mg total) by mouth daily as needed.     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   Tobacco Use   Smoking status: Never   Smokeless tobacco: Never  Vaping Use   Vaping Use: Never used  Substance and Sexual Activity   Alcohol use: No   Drug use: No   Sexual activity: Yes    Birth control/protection: None  Other Topics Concern   Not on file  Social History Narrative   Married    Played golf for years    3 kids husband Engineer, structural    2 kids identical twin boys 2016 born and 47 son 40 y.o    -as of 10/2021 67 y.o and 2 twin 34 y.o boys       Undergraduate degree    Owns guns    Safe in relationship    Wearing seat belt    10/2021 Richardson Chiquito    Social Determinants of Health   Financial Resource Strain: Not on file  Food Insecurity: Not on file  Transportation Needs: Not on file  Physical Activity: Not on file  Stress: Not on file  Social Connections: Not on file     Family History: The patient's family history includes Alcohol abuse  in her paternal grandfather; Breast cancer in her cousin and maternal aunt; Breast cancer (age of onset: 44) in her maternal grandmother; COPD in her mother; Cancer in her mother; Crohn's disease in her father; Diabetes in her maternal grandfather and paternal grandmother; Early death in her maternal grandmother and mother; Hyperlipidemia in her father; Hypertension in her brother and father; Hypothyroidism in her maternal aunt; Ovarian cancer (age of onset: 52) in her maternal grandmother; Ovarian cancer (age of onset: 10) in her maternal aunt; Pancreatic cancer in her mother; Skin cancer in her maternal aunt.  ROS:   Please see the history of present illness.     All other systems reviewed and are negative.  EKGs/Labs/Other Studies Reviewed:    The following studies were reviewed today:   EKG:  EKG is  ordered today.  The ekg ordered today demonstrates normal sinus rhythm, normal ECG  Recent Labs: No results found for  requested labs within last 8760 hours.  Recent Lipid Panel    Component Value Date/Time   CHOL 120 04/08/2018 0910   TRIG 62.0 04/08/2018 0910   HDL 49.60 04/08/2018 0910   CHOLHDL 2 04/08/2018 0910   VLDL 12.4 04/08/2018 0910   LDLCALC 58 04/08/2018 0910     Risk Assessment/Calculations:          Physical Exam:    VS:  BP 130/76 (BP Location: Left Arm, Patient Position: Sitting, Cuff Size: Normal)    Pulse 69    Ht _0  (1.6 m)    Wt 195 lb (88.5 kg)    SpO2 98%    BMI 34.54 kg/m     Wt Readings from Last 3 Encounters:  12/19/21 195 lb (88.5 kg)  11/27/21 196 lb 9.6 oz (89.2 kg)  10/28/21 202 lb (91.6 kg)     GEN:  Well nourished, well developed in no acute distress HEENT: Normal NECK: No JVD; No carotid bruits LYMPHATICS: No lymphadenopathy CARDIAC: RRR, no murmurs, rubs, gallops RESPIRATORY:  Clear to auscultation without rales, wheezing or rhonchi  ABDOMEN: Soft, non-tender, non-distended MUSCULOSKELETAL:  No edema; No deformity  SKIN: Warm and dry NEUROLOGIC:  Alert and oriented x 3 PSYCHIATRIC:  Normal affect   ASSESSMENT:    1. Palpitation   2. Shortness of breath   3. Anxiety     PLAN:    In order of problems listed above:  Palpitations, placed 2-week cardiac monitor to evaluate any significant arrhythmias. Shortness of breath associated with palpitations.  Get echocardiogram to rule out any structural abnormality. History of anxiety, a component of anxiety might be contributing to patient's symptoms.  Obtain cardiac work-up as above.  Management of anxiety as per PCP.    Follow-up after cardiac testing      Medication Adjustments/Labs and Tests Ordered: Current medicines are reviewed at length with the patient today.  Concerns regarding medicines are outlined above.  Orders Placed This Encounter  Procedures   LONG TERM MONITOR (3-14 DAYS)   EKG 12-Lead   ECHOCARDIOGRAM COMPLETE   No orders of the defined types were placed in this  encounter.   Patient Instructions  Medication Instructions:   Your physician recommends that you continue on your current medications as directed. Please refer to the Current Medication list given to you today.  *If you need a refill on your cardiac medications before your next appointment, please call your pharmacy*   Lab Work: None ordered If you have labs (blood work) drawn today and your tests are completely normal,  you will receive your results only by: [place] (if you have MyChart) OR A paper copy in the mail If you have any lab test that is abnormal or we need to change your treatment, we will call you to review the results.   Testing/Procedures:   Your physician has requested that you have an echocardiogram. Echocardiography is a painless test that uses sound waves to create images of your heart. It provides your doctor with information about the size and shape of your heart and how well your hearts chambers and valves are working. This procedure takes approximately one hour. There are no restrictions for this procedure.  2.   Your physician has recommended that you wear a Zio XT monitor for 2 weeks. This will be mailed to your home address in 4-5 business days.  This monitor is a medical device that records the hearts electrical activity. Doctors most often use these monitors to diagnose arrhythmias. Arrhythmias are problems with the speed or rhythm of the heartbeat. The monitor is a small device applied to your chest. You can wear one while you do your normal daily activities. While wearing this monitor if you have any symptoms to push the button and record what you felt. Once you have worn this monitor for the period of time provider prescribed (Usually 14 days), you will return the monitor device in the postage paid box. Once it is returned they will download the data collected and provide [place] with a report which the provider will then review and we will call you with  those results. Important tips:  Avoid showering during the first 24 hours of wearing the monitor. Avoid excessive sweating to help maximize wear time. Do not submerge the device, no hot tubs, and no swimming pools. Keep any lotions or oils away from the patch. After 24 hours you may shower with the patch on. Take brief showers with your back facing the shower head.  Do not remove patch once it has been placed because that will interrupt data and decrease adhesive wear time. Push the button when you have any symptoms and write down what you were feeling. Once you have completed wearing your monitor, remove and place into box which has postage paid and place in your outgoing mailbox.  If for some reason you have misplaced your box then call our office and we can provide another box and/or mail it off for you.       Follow-Up: At [facility], you and your health needs are our priority.  As part of our continuing mission to provide you with exceptional heart care, we have created designated Provider Care Teams.  These Care Teams include your primary Cardiologist (physician) and Advanced Practice Providers (APPs -  Physician Assistants and Nurse Practitioners) who all work together to provide you with the care you need, when you need it.  We recommend signing up for the patient portal called "MyChart".  Sign up information is provided on this After Visit Summary.  MyChart is used to connect with patients for Virtual Visits (Telemedicine).  Patients are able to view lab/test results, encounter notes, upcoming appointments, etc.  Non-urgent messages can be sent to your provider as well.   To learn more about what you can do with MyChart, go to [email or web address].    Your next appointment:   6 week(s)  The format for your next appointment:   In Person  Provider:   You may see Kate Sable, MD or one of the  following Advanced Practice Providers on your designated Care Team:    Murray Hodgkins, NP Christell Faith, PA-C Cadence Kathlen Mody, [place]    Other Instructions     Signed, Kate Sable, MD  12/19/2021 11:05 AM    [place]

## 2021-12-21 DIAGNOSIS — R002 Palpitations: Secondary | ICD-10-CM | POA: Diagnosis not present

## 2022-01-08 DIAGNOSIS — Z1589 Genetic susceptibility to other disease: Secondary | ICD-10-CM

## 2022-01-08 HISTORY — DX: Genetic susceptibility to other disease: Z15.89

## 2022-01-13 ENCOUNTER — Ambulatory Visit (INDEPENDENT_AMBULATORY_CARE_PROVIDER_SITE_OTHER): Payer: BC Managed Care – PPO | Admitting: Obstetrics and Gynecology

## 2022-01-13 ENCOUNTER — Other Ambulatory Visit: Payer: Self-pay

## 2022-01-13 ENCOUNTER — Encounter: Payer: Self-pay | Admitting: Obstetrics and Gynecology

## 2022-01-13 VITALS — BP 122/72 | Ht 63.0 in | Wt 196.0 lb

## 2022-01-13 DIAGNOSIS — Z01419 Encounter for gynecological examination (general) (routine) without abnormal findings: Secondary | ICD-10-CM

## 2022-01-13 DIAGNOSIS — Z803 Family history of malignant neoplasm of breast: Secondary | ICD-10-CM | POA: Diagnosis not present

## 2022-01-13 DIAGNOSIS — Z8041 Family history of malignant neoplasm of ovary: Secondary | ICD-10-CM | POA: Diagnosis not present

## 2022-01-13 DIAGNOSIS — Z800 Family history of malignant neoplasm of digestive organs: Secondary | ICD-10-CM

## 2022-01-13 DIAGNOSIS — R002 Palpitations: Secondary | ICD-10-CM | POA: Diagnosis not present

## 2022-01-13 DIAGNOSIS — Z8481 Family history of carrier of genetic disease: Secondary | ICD-10-CM | POA: Diagnosis not present

## 2022-01-13 DIAGNOSIS — Z808 Family history of malignant neoplasm of other organs or systems: Secondary | ICD-10-CM | POA: Diagnosis not present

## 2022-01-13 NOTE — Patient Instructions (Signed)
I value your feedback and you entrusting us with your care. If you get a [facility] patient survey, I would appreciate you taking the time to let us know about your experience today. Thank you! ? ? ?

## 2022-01-13 NOTE — Progress Notes (Signed)
? ?PCP:  McLean-Scocuzza, Nino Glow, MD ? ? ?Chief Complaint  ?Patient presents with  ? Gynecologic Exam  ? ? ? ?HPI: ?     Ms. Anita Jacobs is a 36 y.o. [phone] whose LMP was Patient's last menstrual period was 01/01/2022 (exact date)., presents today for her annual examination.  Her menses are regular every 28-30 days, lasting 4-5 days.  Dysmenorrhea none. She does not have intermenstrual bleeding. ? ?Sex activity: single partner, contraception - condoms, declines other BC ?Last Pap: 11/29/20 Results were: no abnormalities /neg HPV DNA ? ?There is a strong FH of breast cancer. There is a FH of ovarian cancer in her mat aunt. Her mom with hx of pancreatic cancer and was BRCA 2 positive; pt is BRCA neg. Family followed at [facility]; last testing done about 8 yrs ago. Pt wonders about update testing. The patient does do self-breast exams. ? ?Tobacco use: The patient denies current or previous tobacco use. ?Alcohol use: none ?No drug use.  ?Exercise: moderately active ? ?She does get adequate calcium and Vitamin D in her diet. ?Labs with PCP ? ?Past Medical History:  ?Diagnosis Date  ? Anxiety   ? BRCA negative   ? family study at [place]- Dr. Pearlie Oyster pos brca 2 gene in family  ? Chicken pox   ? Chronic back pain   ? Chronic right-sided low back pain with right-sided sciatica 10/16/2021  ? COVID-19   ? late 11/2021  ? Depression   ? Family history of BRCA2 gene positive   ? mother  ? Hair loss   ? Hematuria   ? Kidney stone   ? macrobid daily  ? Obesity (BMI 35.0-39.9 without comorbidity)   ? Ureteral colic   ? ? ?Past Surgical History:  ?Procedure Laterality Date  ? TONSILLECTOMY    ? 2007  ? WISDOM TOOTH EXTRACTION    ? ? ?Family History  ?Problem Relation Age of Onset  ? Cancer Mother   ?     pancreatic ca dx 29 BRCA +  ? Pancreatic cancer Mother   ? COPD Mother   ?     pancreatitc cancer BRCA2 +  ? Early death Mother   ? Hypertension Father   ? Hyperlipidemia Father   ? Crohn's disease Father   ?      dxed ? had in early 55 dx at 33  ? Hypertension Brother   ? Ovarian cancer Maternal Grandmother 85  ? Breast cancer Maternal Grandmother 41  ? Early death Maternal Grandmother   ? Diabetes Maternal Grandfather   ? Diabetes Paternal Grandmother   ? Alcohol abuse Paternal Grandfather   ? Ovarian cancer Maternal Aunt 65  ? Breast cancer Maternal Aunt   ?     Great Aunts  ? Skin cancer Maternal Aunt   ? Hypothyroidism Maternal Aunt   ? Breast cancer Cousin   ? ? ?Social History  ? ?Socioeconomic History  ? Marital status: Married  ?  Spouse name: Not on file  ? Number of children: Not on file  ? Years of education: Not on file  ? Highest education level: Not on file  ?Occupational History  ? 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  Comment: former [employer]   ?Tobacco Use  ? Smoking status: Never  ? Smokeless tobacco: Never  ?Vaping Use  ? Vaping Use: Never used  ?Substance and Sexual Activity  ? Alcohol use: No  ? Drug use: No  ? Sexual  activity: Yes  ?  Birth control/protection: None  ?Other Topics Concern  ? Not on file  ?Social History Narrative  ? Married   ? Played golf for years   ? 3 kids husband Engineer, structural   ? 2 kids identical twin boys 2016 born and 1 son 47 y.o   ? -as of 10/2021 52 y.o and 2 twin 34 y.o boys   ?   ? Undergraduate degree   ? Owns guns   ? Safe in relationship   ? Wearing seat belt   ? 10/2021 Richardson Chiquito   ? ?Social Determinants of Health  ? ?Financial Resource Strain: Not on file  ?Food Insecurity: Not on file  ?Transportation Needs: Not on file  ?Physical Activity: Not on file  ?Stress: Not on file  ?Social Connections: Not on file  ?Intimate Partner Violence: Not on file  ? ? ? ?Current Outpatient Medications:  ?  citalopram (CELEXA) 20 MG tablet, Take 1 tablet (20 mg total) by mouth daily., Disp: 90 tablet, Rfl: 3 ?  hydrOXYzine (ATARAX) 25 MG tablet, Take 0.5-1 tablets (12.5-25 mg total) by mouth daily as needed., Disp: 90 tablet, Rfl: 3 ? ? ? ? ?ROS: ? ?Review of Systems   ?Constitutional:  Negative for fatigue, fever and unexpected weight change.  ?Respiratory:  Negative for cough, shortness of breath and wheezing.   ?Cardiovascular:  Negative for chest pain, palpitations and leg swelling.  ?Gastrointestinal:  Negative for blood in stool, constipation, diarrhea, nausea and vomiting.  ?Endocrine: Negative for cold intolerance, heat intolerance and polyuria.  ?Genitourinary:  Negative for dyspareunia, dysuria, flank pain, frequency, genital sores, hematuria, menstrual problem, pelvic pain, urgency, vaginal bleeding, vaginal discharge and vaginal pain.  ?Musculoskeletal:  Negative for back pain, joint swelling and myalgias.  ?Skin:  Negative for rash.  ?Neurological:  Negative for dizziness, syncope, light-headedness, numbness and headaches.  ?Hematological:  Negative for adenopathy.  ?Psychiatric/Behavioral:  Positive for agitation. Negative for confusion, sleep disturbance and suicidal ideas. The patient is not nervous/anxious.   ?BREAST: No symptoms ? ? ?Objective: ?BP 122/72   Ht _0  (1.6 m)   Wt 196 lb (88.9 kg)   LMP 01/01/2022 (Exact Date)   BMI 34.72 kg/m?  ? ? ?Physical Exam ?Constitutional:   ?   Appearance: She is well-developed.  ?Genitourinary:  ?   Vulva normal.  ?   Right Labia: No rash, tenderness or lesions. ?   Left Labia: No tenderness, lesions or rash. ?   No vaginal discharge, erythema or tenderness.  ? ?   Right Adnexa: not tender and no mass present. ?   Left Adnexa: not tender and no mass present. ?   No cervical friability or polyp.  ?   Uterus is not enlarged or tender.  ?Breasts: ?   Right: No mass, nipple discharge, skin change or tenderness.  ?   Left: No mass, nipple discharge, skin change or tenderness.  ?Neck:  ?   Thyroid: No thyromegaly.  ?Cardiovascular:  ?   Rate and Rhythm: Normal rate and regular rhythm.  ?   Heart sounds: Normal heart sounds. No murmur heard. ?Pulmonary:  ?   Effort: Pulmonary effort is normal.  ?   Breath sounds: Normal  breath sounds.  ?Abdominal:  ?   Palpations: Abdomen is soft.  ?   Tenderness: There is no abdominal tenderness. There is no guarding or rebound.  ?Musculoskeletal:     ?   General: Normal range of motion.  ?  Cervical back: Normal range of motion.  ?Lymphadenopathy:  ?   Cervical: No cervical adenopathy.  ?Neurological:  ?   General: No focal deficit present.  ?   Mental Status: She is alert and oriented to person, place, and time.  ?   Cranial Nerves: No cranial nerve deficit.  ?Skin: ?   General: Skin is warm and dry.  ?Psychiatric:     ?   Mood and Affect: Mood normal.     ?   Behavior: Behavior normal.     ?   Thought Content: Thought content normal.     ?   Judgment: Judgment normal.  ?Vitals reviewed.  ? ? ?Assessment/Plan: ?Encounter for annual routine gynecological examination ? ?Family history of BRCA2 gene positive--pt is BRCA neg. Will do update testing since last tested ~8 yrs ago. Will f/u with results.  ? ?Family history of breast cancer - Plan: Integrated BRACAnalysis ([place]) ? ?Family history of pancreatic cancer - Plan: Integrated BRACAnalysis ([place]) ? ?Family history of ovarian cancer - Plan: Integrated BRACAnalysis ([place]) ? ?GYN counsel adequate intake of calcium and vitamin D, diet and exercise ? ? ?  F/U ? Return in about 1 year (around 01/14/2023). ? ?Lawton Dollinger B. Rodrigues [place], PA-C ?01/13/2022 ?4:04 PM ?

## 2022-01-15 ENCOUNTER — Ambulatory Visit (INDEPENDENT_AMBULATORY_CARE_PROVIDER_SITE_OTHER): Payer: BC Managed Care – PPO

## 2022-01-15 ENCOUNTER — Other Ambulatory Visit: Payer: Self-pay

## 2022-01-15 DIAGNOSIS — R0602 Shortness of breath: Secondary | ICD-10-CM | POA: Diagnosis not present

## 2022-01-16 LAB — ECHOCARDIOGRAM COMPLETE
AR max vel: 2.66 cm2
AV Area VTI: 2.74 cm2
AV Area mean vel: 2.69 cm2
AV Mean grad: 3 mmHg
AV Peak grad: 4.9 mmHg
Ao pk vel: 1.11 m/s
Area-P 1/2: 3.37 cm2
Calc EF: 56.9 %
S' Lateral: 3.4 cm
Single Plane A2C EF: 58.2 %
Single Plane A4C EF: 55.7 %

## 2022-01-30 ENCOUNTER — Encounter: Payer: Self-pay | Admitting: Obstetrics and Gynecology

## 2022-01-30 DIAGNOSIS — Z1589 Genetic susceptibility to other disease: Secondary | ICD-10-CM

## 2022-02-05 ENCOUNTER — Encounter: Payer: Self-pay | Admitting: Obstetrics and Gynecology

## 2022-02-06 ENCOUNTER — Encounter: Payer: Self-pay | Admitting: Nurse Practitioner

## 2022-02-06 NOTE — Progress Notes (Signed)
? ? ?Office Visit  ?  ?Patient Name: Anita Jacobs ?Date of Encounter: 02/07/2022 ? ?Primary Care Provider:  McLean-Scocuzza, Nino Glow, MD ?Primary Cardiologist:  Kate Sable, MD ? ?Chief Complaint  ?  ?36 year old female with a history of anxiety, chest pain, and obesity, who presents for follow-up related to palpitations. ? ?Past Medical History  ?  ?Past Medical History:  ?Diagnosis Date  ? Anxiety   ? BRCA negative   ? family study at [facility]- Dr. Pearlie Oyster pos brca 2 gene in family  ? Chicken pox   ? Chronic back pain   ? Chronic right-sided low back pain with right-sided sciatica 10/16/2021  ? COVID-19   ? late 11/2021  ? Depression   ? Family history of BRCA2 gene positive   ? mother  ? Hair loss   ? Hematuria   ? History of echocardiogram   ? a. 01/2022 Echo: EF 55-60%, nl RV fxn. No significant valvular disease.  ? Kidney stone   ? macrobid daily  ? Monoallelic mutation of SDHA gene 01/2022  ? MyRisk testing; increased risk of renal cancer, paragangliomas and pheochromocytomas  ? Obesity (BMI 35.0-39.9 without comorbidity)   ? Palpitations   ? a. 12/2021 Zio: Predominantly Sinus Rhythm, avg HR 74 (45-169). 3 brief runs of PSVT - longest 7 beats, fastest 144. Rare PACs/PVCs.  ? Ureteral colic   ? ?Past Surgical History:  ?Procedure Laterality Date  ? TONSILLECTOMY    ? 2007  ? WISDOM TOOTH EXTRACTION    ? ? ?Allergies ? ?Allergies  ?Allergen Reactions  ? Codeine Nausea Only  ? ? ?History of Present Illness  ?  ?36 year old female with a history of anxiety, chest pain, obesity, and palpitations.  She was initially evaluated in cardiology clinic in December 2022 in the setting of chest pain that occurred exclusively during periods of anxiety.  She noted good exercise tolerance without symptoms, and chest pain was felt to be noncardiac with recommendation for conservative management.  At February 2023 follow-up, she reported palpitations associated with dyspnea occurring most days, lasting a  few seconds, resolving spontaneously.  A Zio monitor was placed and showed predominantly sinus rhythm with an average heart rate of 74 bpm.  She did have 3 brief runs of PSVT-the longest lasting 7 beats with a fastest rate of 144 bpm.  Rare PACs and PVCs were also noted.  Echocardiogram was carried out earlier this month which showed an EF of 55 to 60%, without significant valvular disease. ? ?Since her last visit, she has noted intermittent and brief palpitations overall, these seem to be less frequent and more tolerable.  Symptoms do not interfere with activity and are not associated with dyspnea, chest pain, or lightheadedness.  She has been increasing her activity after which she describes a several years of being lazy.  No dizziness, syncope, edema. ? ?Home Medications  ?  ?Current Outpatient Medications  ?Medication Sig Dispense Refill  ? cholecalciferol (VITAMIN D3) 25 MCG (1000 UNIT) tablet Take 1,000 Units by mouth daily.    ? CYANOCOBALAMIN PO Take 2 tablets by mouth daily.    ? ?No current facility-administered medications for this visit.  ?  ? ?Review of Systems  ?  ?Occasional, brief palpitation, though overall improved.  She denies chest pain, dyspnea, pnd, orthopnea, n, v, dizziness, syncope, edema, weight gain, or early satiety.  All other systems reviewed and are otherwise negative except as noted above. ?  ? ?Physical Exam  ?  ?  VS:  BP 120/80 (BP Location: Left Arm, Patient Position: Sitting, Cuff Size: Large)   Pulse 67   Ht '5\' 3"'  (1.6 m)   Wt 196 lb (88.9 kg)   SpO2 99%   BMI 34.72 kg/m?  , BMI Body mass index is 34.72 kg/m?. ?    ?GEN: Well nourished, well developed, in no acute distress. ?HEENT: normal. ?Neck: Supple, no JVD, carotid bruits, or masses. ?Cardiac: RRR, no murmurs, rubs, or gallops. No clubbing, cyanosis, edema.  Radials/PT 2+ and equal bilaterally.  ?Respiratory:  Respirations regular and unlabored, clear to auscultation bilaterally. ?GI: Soft, nontender, nondistended, BS +  x 4. ?MS: no deformity or atrophy. ?Skin: warm and dry, no rash. ?Neuro:  Strength and sensation are intact. ?Psych: Normal affect. ? ?Accessory Clinical Findings  ?  ?Lab Results  ?Component Value Date  ? WBC 6.2 04/08/2018  ? HGB 13.8 04/08/2018  ? HCT 40.1 04/08/2018  ? MCV 91.6 04/08/2018  ? PLT 323.0 04/08/2018  ? ?Lab Results  ?Component Value Date  ? CREATININE 0.76 04/08/2018  ? BUN 11 04/08/2018  ? NA 135 04/08/2018  ? K 4.5 04/08/2018  ? CL 103 04/08/2018  ? CO2 25 04/08/2018  ? ?Lab Results  ?Component Value Date  ? ALT 7 04/08/2018  ? AST 14 04/08/2018  ? ALKPHOS 29 (L) 04/08/2018  ? BILITOT 0.5 04/08/2018  ? ?Lab Results  ?Component Value Date  ? CHOL 120 04/08/2018  ? HDL 49.60 04/08/2018  ? Cashion 58 04/08/2018  ? TRIG 62.0 04/08/2018  ? CHOLHDL 2 04/08/2018  ?  ? ?Assessment & Plan  ?  ?1.  Palpitations/PSVT: Patient with a history of brief palpitations without associated symptoms.  Recent ZIO monitoring showed sinus rhythm with an average heart rate of 74.  She did have 3 brief episodes of PSVT, the longest lasting 7 beats, along with rare PACs and PVCs.  We discussed her monitor results in detail today and reassurance offered.  Overall, symptoms seem to have improved, though still occur but less frequently.  We discussed limiting caffeine intake (currently drinking 2 large cups of coffee daily), regular activity, appropriate hydration, and a well-rounded diet.  Given relative paucity of symptoms without significant findings on monitoring, we collectively agreed to forego initiating a beta-blocker or calcium channel blocker at this time. ? ?2.  Disposition: Follow-up as needed. ? ? ?Murray Hodgkins, NP ?02/07/2022, 8:21 AM ? ?

## 2022-02-07 ENCOUNTER — Ambulatory Visit: Payer: BC Managed Care – PPO | Admitting: Nurse Practitioner

## 2022-02-07 ENCOUNTER — Encounter: Payer: Self-pay | Admitting: Nurse Practitioner

## 2022-02-07 VITALS — BP 120/80 | HR 67 | Ht 63.0 in | Wt 196.0 lb

## 2022-02-07 DIAGNOSIS — I471 Supraventricular tachycardia: Secondary | ICD-10-CM | POA: Diagnosis not present

## 2022-02-07 DIAGNOSIS — R002 Palpitations: Secondary | ICD-10-CM | POA: Diagnosis not present

## 2022-02-07 NOTE — Patient Instructions (Signed)
Medication Instructions:  None *If you need a refill on your cardiac medications before your next appointment, please call your pharmacy*  Lab Work: None If you have labs (blood work) drawn today and your tests are completely normal, you will receive your results only by: . MyChart Message (if you have MyChart) OR . A paper copy in the mail If you have any lab test that is abnormal or we need to change your treatment, we will call you to review the results.  Testing/Procedures: None  Follow-Up: At [facility], you and your health needs are our priority.  As part of our continuing mission to provide you with exceptional heart care, we have created designated Provider Care Teams.  These Care Teams include your primary Cardiologist (physician) and Advanced Practice Providers (APPs -  Physician Assistants and Nurse Practitioners) who all work together to provide you with the care you need, when you need it.  Your next appointment:   As needed  

## 2022-02-19 ENCOUNTER — Telehealth: Payer: Self-pay | Admitting: Obstetrics and Gynecology

## 2022-02-19 NOTE — Telephone Encounter (Signed)
Pt aware of SDHA mutation on MyRisk results. IBIS=14.1%/riskscore=8.5%. As far as mutation for FH of cancers, pt is negative and isn't at increased risk of breast cancer. No increased screening options recommended.  ? ?SDHA is newer genetic mutation so I'm discussing with Mikal Plane at [facility] as to best way to manage pt. Pt aware; I'll get back to her on mgmt options. No FH of SDHA cancers that pt is aware of. Unsure when pt's children need to do testing since presents in early childhood. Pt does not have HTN or other adrenal tumor sx.  ? ?Patient understands these results only apply to her and her children, and this is not indicative of genetic testing results of her other family members. It is recommended that her other family members have genetic testing done. ? ?Pt also understands negative genetic testing of other genes in panel doesn't mean she will never get any of these cancers.  ? ?Hard copy mailed to pt. F/u prn.  ? ?

## 2022-03-04 ENCOUNTER — Other Ambulatory Visit: Payer: Self-pay | Admitting: Obstetrics and Gynecology

## 2022-03-04 DIAGNOSIS — Z1589 Genetic susceptibility to other disease: Secondary | ICD-10-CM

## 2022-03-04 NOTE — Progress Notes (Signed)
Pt would like to try to get into [facility] sooner for [facility] mutation eval and mgmt ?

## 2022-03-31 DIAGNOSIS — Z1589 Genetic susceptibility to other disease: Secondary | ICD-10-CM | POA: Diagnosis not present

## 2022-03-31 DIAGNOSIS — R002 Palpitations: Secondary | ICD-10-CM | POA: Diagnosis not present

## 2022-03-31 DIAGNOSIS — F419 Anxiety disorder, unspecified: Secondary | ICD-10-CM | POA: Diagnosis not present

## 2022-04-15 ENCOUNTER — Encounter: Payer: Self-pay | Admitting: Internal Medicine

## 2022-04-20 ENCOUNTER — Telehealth: Payer: Self-pay | Admitting: Internal Medicine

## 2022-04-20 DIAGNOSIS — Z8379 Family history of other diseases of the digestive system: Secondary | ICD-10-CM | POA: Insufficient documentation

## 2022-04-20 DIAGNOSIS — R194 Change in bowel habit: Secondary | ICD-10-CM | POA: Insufficient documentation

## 2022-04-20 DIAGNOSIS — K921 Melena: Secondary | ICD-10-CM | POA: Insufficient documentation

## 2022-04-20 NOTE — Telephone Encounter (Signed)
Anita Jacobs Last note 11/27/21  Inform referred to [facility] clinic GI  She can call daily for cancellations but GI is booked out at all locations  [address] [place], [place], [place] [address] Phone: [phone]

## 2022-04-20 NOTE — Addendum Note (Signed)
Addended by: Orland Mustard on: 04/20/2022 11:12 PM   Modules accepted: Orders

## 2022-06-03 DIAGNOSIS — F411 Generalized anxiety disorder: Secondary | ICD-10-CM | POA: Diagnosis not present

## 2022-06-03 DIAGNOSIS — R0602 Shortness of breath: Secondary | ICD-10-CM | POA: Diagnosis not present

## 2022-07-08 DIAGNOSIS — Z1589 Genetic susceptibility to other disease: Secondary | ICD-10-CM | POA: Diagnosis not present

## 2022-07-08 DIAGNOSIS — Z7189 Other specified counseling: Secondary | ICD-10-CM | POA: Diagnosis not present

## 2022-07-15 DIAGNOSIS — R194 Change in bowel habit: Secondary | ICD-10-CM | POA: Diagnosis not present

## 2022-07-17 DIAGNOSIS — R194 Change in bowel habit: Secondary | ICD-10-CM | POA: Diagnosis not present

## 2022-08-11 DIAGNOSIS — K591 Functional diarrhea: Secondary | ICD-10-CM | POA: Diagnosis not present

## 2022-08-11 DIAGNOSIS — R194 Change in bowel habit: Secondary | ICD-10-CM | POA: Diagnosis not present

## 2022-08-11 DIAGNOSIS — R197 Diarrhea, unspecified: Secondary | ICD-10-CM | POA: Diagnosis not present

## 2022-08-11 DIAGNOSIS — K5730 Diverticulosis of large intestine without perforation or abscess without bleeding: Secondary | ICD-10-CM | POA: Diagnosis not present

## 2022-08-15 DIAGNOSIS — H10021 Other mucopurulent conjunctivitis, right eye: Secondary | ICD-10-CM | POA: Diagnosis not present

## 2022-08-22 DIAGNOSIS — Z1509 Genetic susceptibility to other malignant neoplasm: Secondary | ICD-10-CM | POA: Diagnosis not present

## 2022-08-22 DIAGNOSIS — Z1589 Genetic susceptibility to other disease: Secondary | ICD-10-CM | POA: Diagnosis not present

## 2022-08-22 DIAGNOSIS — Z0189 Encounter for other specified special examinations: Secondary | ICD-10-CM | POA: Diagnosis not present

## 2022-11-26 ENCOUNTER — Encounter: Payer: BC Managed Care – PPO | Admitting: Internal Medicine

## 2022-12-30 DIAGNOSIS — J9811 Atelectasis: Secondary | ICD-10-CM | POA: Diagnosis not present

## 2022-12-30 DIAGNOSIS — Z1589 Genetic susceptibility to other disease: Secondary | ICD-10-CM | POA: Diagnosis not present

## 2022-12-30 DIAGNOSIS — Z0189 Encounter for other specified special examinations: Secondary | ICD-10-CM | POA: Diagnosis not present
